# Patient Record
Sex: Male | Born: 1940
Health system: Southern US, Community
[De-identification: ages and names within clinical notes are randomized; demographics above are authoritative.]

## PROBLEM LIST (undated history)

## (undated) DIAGNOSIS — I10 Essential (primary) hypertension: Secondary | ICD-10-CM

## (undated) DIAGNOSIS — Z87442 Personal history of urinary calculi: Secondary | ICD-10-CM

## (undated) HISTORY — PX: OTHER SURGICAL HISTORY: SHX169

## (undated) HISTORY — PX: APPENDECTOMY: SHX54

## (undated) HISTORY — PX: UMBILICAL HERNIA REPAIR: SHX196

---

## 2002-01-02 ENCOUNTER — Ambulatory Visit (HOSPITAL_COMMUNITY): Admission: RE | Admit: 2002-01-02 | Discharge: 2002-01-02 | Payer: Self-pay | Admitting: Internal Medicine

## 2002-01-02 ENCOUNTER — Encounter: Payer: Self-pay | Admitting: Internal Medicine

## 2003-05-20 ENCOUNTER — Ambulatory Visit (HOSPITAL_COMMUNITY): Admission: RE | Admit: 2003-05-20 | Discharge: 2003-05-20 | Payer: Self-pay | Admitting: Internal Medicine

## 2003-09-11 ENCOUNTER — Ambulatory Visit (HOSPITAL_COMMUNITY): Admission: RE | Admit: 2003-09-11 | Discharge: 2003-09-11 | Payer: Self-pay | Admitting: Internal Medicine

## 2004-07-28 ENCOUNTER — Ambulatory Visit (HOSPITAL_COMMUNITY): Admission: RE | Admit: 2004-07-28 | Discharge: 2004-07-28 | Payer: Self-pay | Admitting: Internal Medicine

## 2006-01-10 ENCOUNTER — Ambulatory Visit (HOSPITAL_COMMUNITY): Admission: RE | Admit: 2006-01-10 | Discharge: 2006-01-10 | Payer: Self-pay | Admitting: Internal Medicine

## 2006-09-01 ENCOUNTER — Ambulatory Visit (HOSPITAL_COMMUNITY): Admission: RE | Admit: 2006-09-01 | Discharge: 2006-09-01 | Payer: Self-pay | Admitting: Internal Medicine

## 2007-12-19 IMAGING — US US CAROTID DUPLEX BILAT
1 series · 13 of 24 positions shown · non-contrast
Comparison: None.

CLINICAL DATA: Amaurosis fugax left eye. History of hypertension.

BILATERAL EXTRACRANIAL CAROTID AND VERTEBRAL ARTERY DUPLEX  DOPPLER ULTRASOUND
EVALUATION  01/10/2006:

[Series 1: unknown · 0.07mm/px · 13 of 57 slices shown]
[im 1/57]
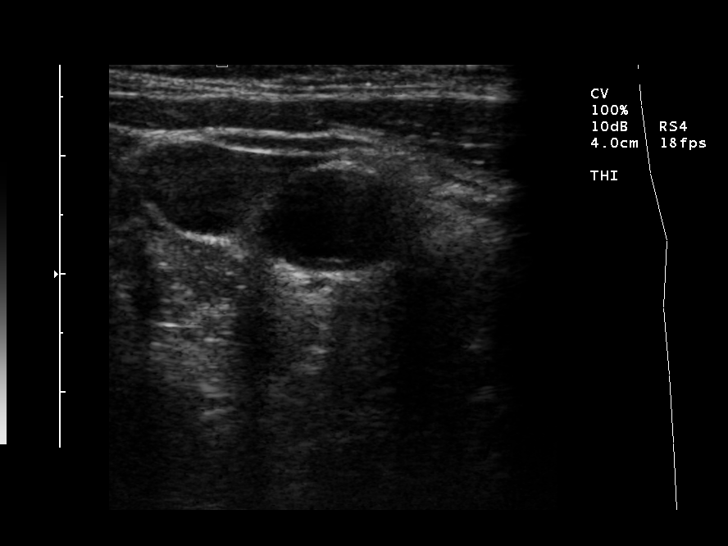
[im 5/57]
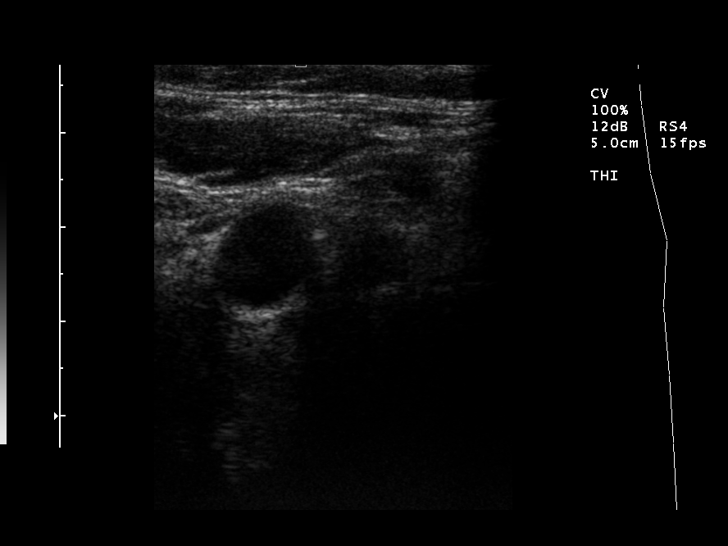
[im 10/57]
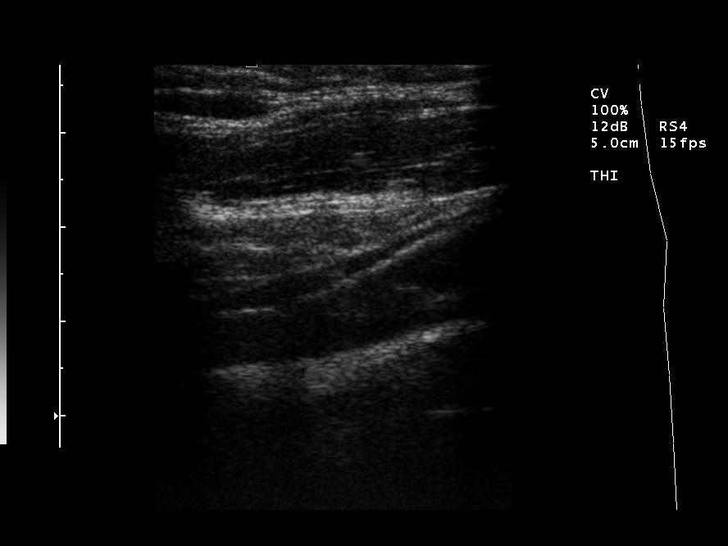
[im 15/57]
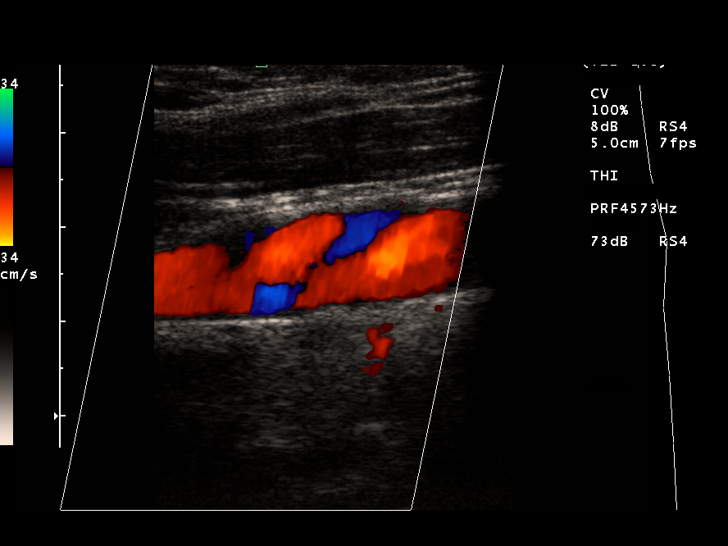
[im 20/57]
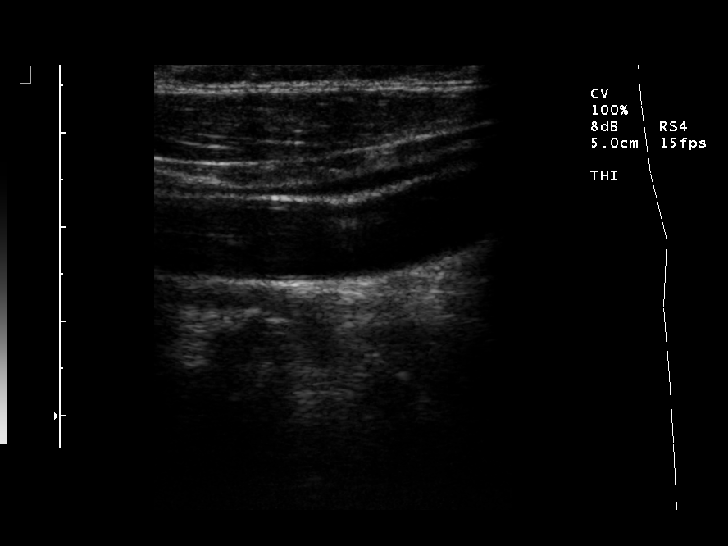
[im 25/57]
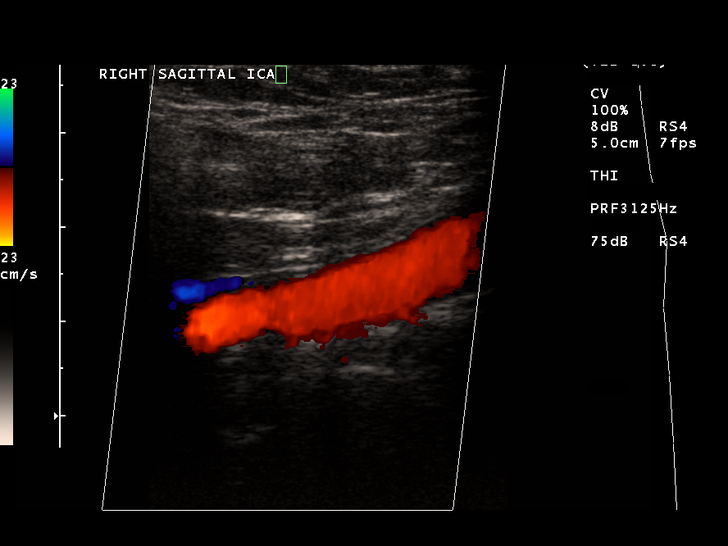
[im 30/57]
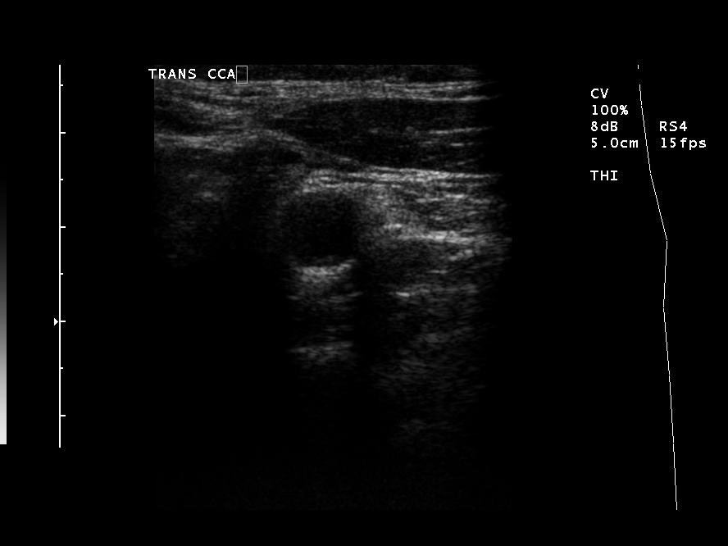
[im 32/57]
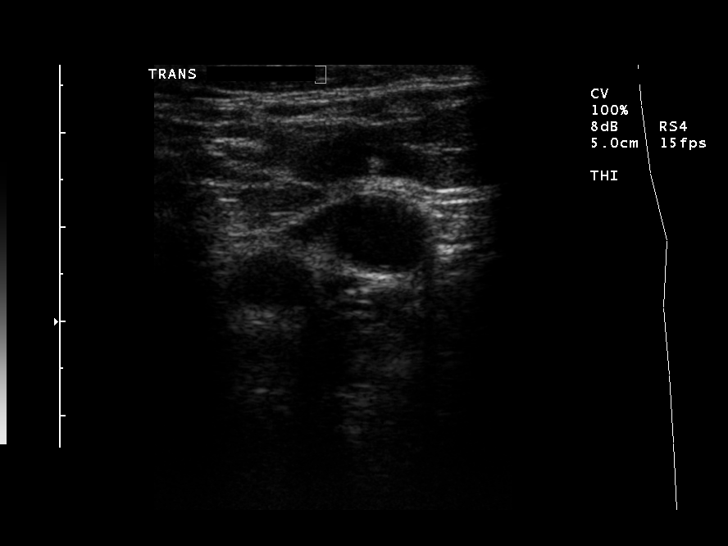
[im 37/57]
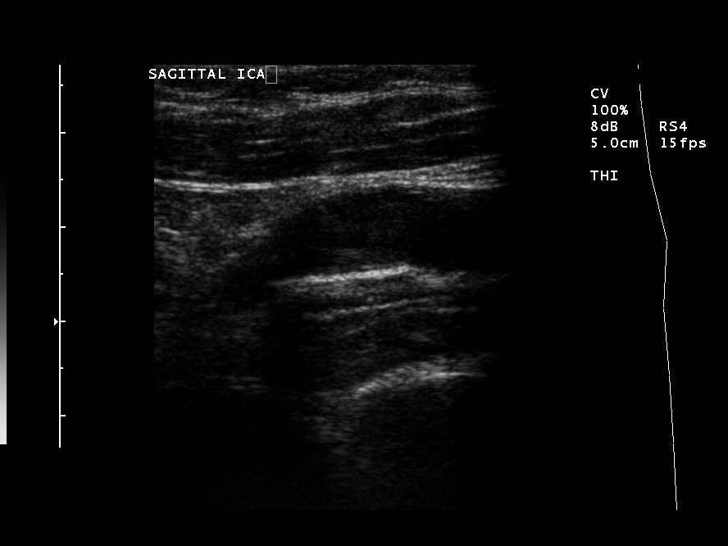
[im 42/57]
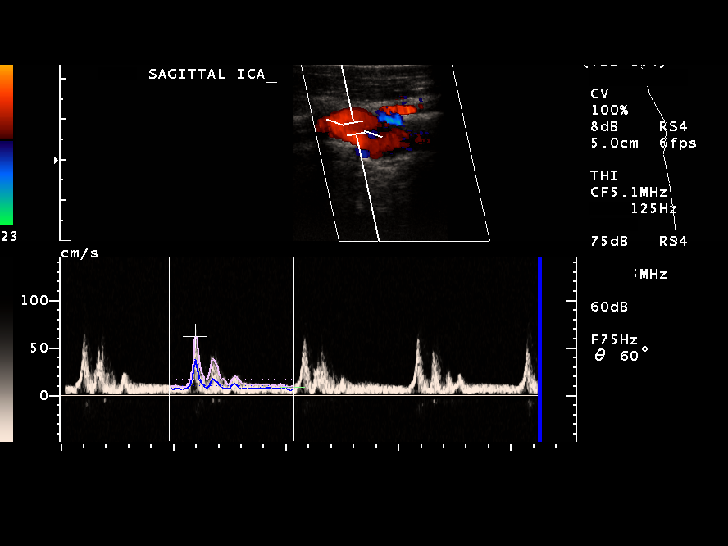
[im 47/57]
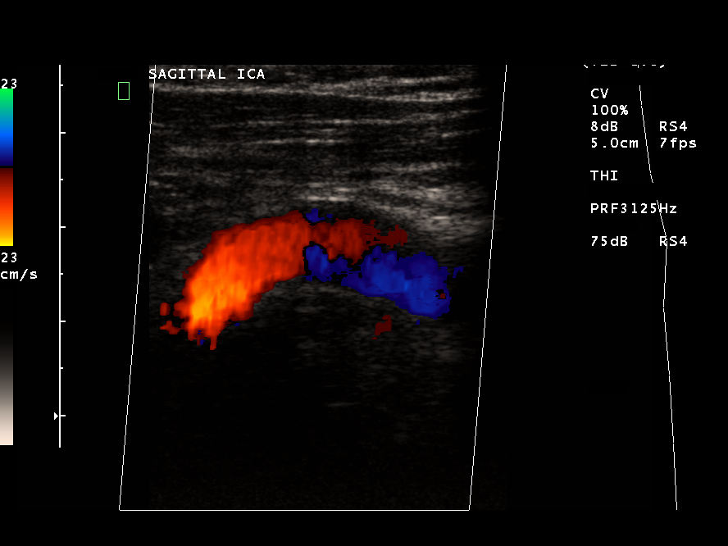
[im 52/57]
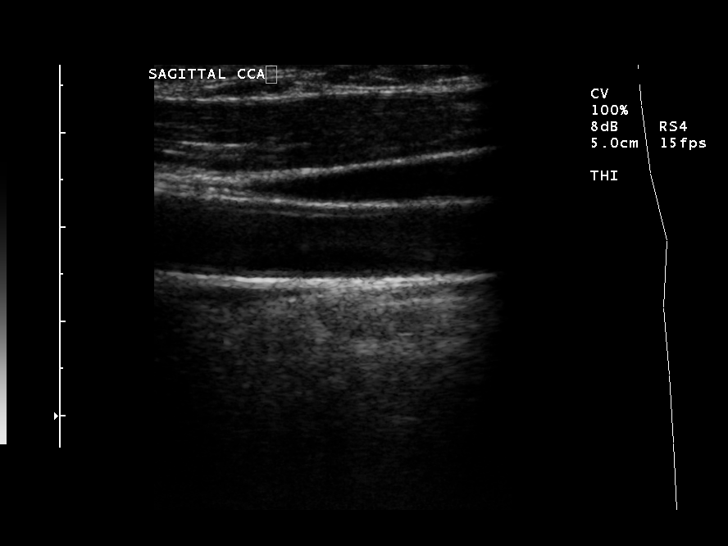
[im 57/57]
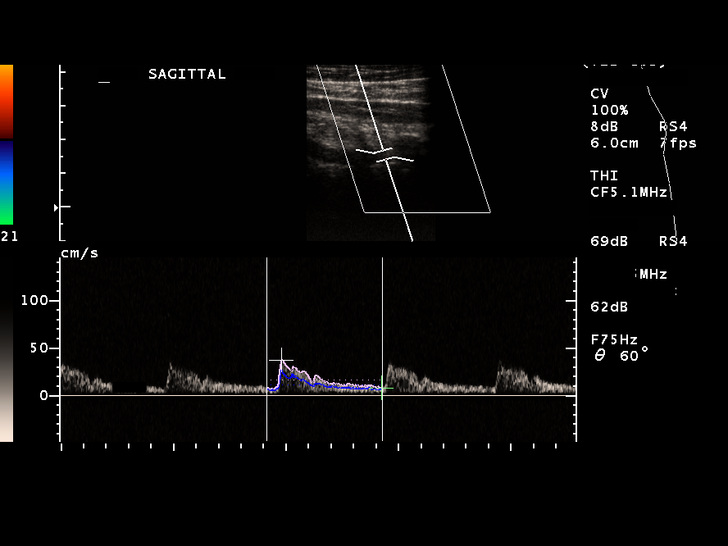

[13 of 24 positions shown; findings below may reference images not displayed]

FINDINGS: The following velocities were measured in centimeters per second (peak
systolic velocity/end-diastolic velocity) :

     RIGHT      LEFT
Common carotid artery:   72.1/8.4   111.9/10.6
Internal carotid artery:   80.8/22.3   64.7/15.1 
External carotid artery:   76.6/6.4   94.5/9.5
ICA/CCA peak systolic ratio:
ICA/CCA end diastolic ratio:

Right carotid:  Minimal soft plaque is noted in the distal right common carotid
artery. This is certainly not flow-limiting visually. The Doppler waveform of
the common carotid is normal. There is no significant plaque within the proximal
right internal carotid artery, and it also has a normal appearing waveform.
Similarly, the right external carotid artery has no significant plaque and a
normal waveform.

Right vertebral:   Antegrade flow.

Left carotid:  There is no significant plaque within the left common carotid
artery, left internal carotid artery, or left external carotid artery. The
Doppler waveforms of each of these vessels is normal.

Left vertebral:   Antegrade flow
IMPRESSION: 1. Minimal soft plaque in the distal right common carotid artery which is not
flow-limiting. There is no significant plaque elsewhere within either carotid
circulation, and there is no evidence of hemodynamically significant stenosis.
2. Antegrade flow in both vertebral arteries.

## 2011-09-28 DIAGNOSIS — L821 Other seborrheic keratosis: Secondary | ICD-10-CM | POA: Diagnosis not present

## 2011-09-28 DIAGNOSIS — Z85828 Personal history of other malignant neoplasm of skin: Secondary | ICD-10-CM | POA: Diagnosis not present

## 2011-09-28 DIAGNOSIS — L570 Actinic keratosis: Secondary | ICD-10-CM | POA: Diagnosis not present

## 2012-02-07 DIAGNOSIS — N400 Enlarged prostate without lower urinary tract symptoms: Secondary | ICD-10-CM | POA: Diagnosis not present

## 2012-02-07 DIAGNOSIS — R350 Frequency of micturition: Secondary | ICD-10-CM | POA: Diagnosis not present

## 2012-02-07 DIAGNOSIS — N200 Calculus of kidney: Secondary | ICD-10-CM | POA: Diagnosis not present

## 2012-03-28 DIAGNOSIS — L570 Actinic keratosis: Secondary | ICD-10-CM | POA: Diagnosis not present

## 2012-03-28 DIAGNOSIS — L821 Other seborrheic keratosis: Secondary | ICD-10-CM | POA: Diagnosis not present

## 2012-03-28 DIAGNOSIS — Z85828 Personal history of other malignant neoplasm of skin: Secondary | ICD-10-CM | POA: Diagnosis not present

## 2012-04-24 DIAGNOSIS — Z125 Encounter for screening for malignant neoplasm of prostate: Secondary | ICD-10-CM | POA: Diagnosis not present

## 2012-04-24 DIAGNOSIS — Z79899 Other long term (current) drug therapy: Secondary | ICD-10-CM | POA: Diagnosis not present

## 2012-04-24 DIAGNOSIS — I10 Essential (primary) hypertension: Secondary | ICD-10-CM | POA: Diagnosis not present

## 2012-05-02 DIAGNOSIS — I10 Essential (primary) hypertension: Secondary | ICD-10-CM | POA: Diagnosis not present

## 2012-05-02 DIAGNOSIS — I498 Other specified cardiac arrhythmias: Secondary | ICD-10-CM | POA: Diagnosis not present

## 2012-05-02 DIAGNOSIS — N400 Enlarged prostate without lower urinary tract symptoms: Secondary | ICD-10-CM | POA: Diagnosis not present

## 2012-05-02 DIAGNOSIS — Z1212 Encounter for screening for malignant neoplasm of rectum: Secondary | ICD-10-CM | POA: Diagnosis not present

## 2012-05-31 DIAGNOSIS — H02839 Dermatochalasis of unspecified eye, unspecified eyelid: Secondary | ICD-10-CM | POA: Diagnosis not present

## 2012-05-31 DIAGNOSIS — H524 Presbyopia: Secondary | ICD-10-CM | POA: Diagnosis not present

## 2012-05-31 DIAGNOSIS — H2510 Age-related nuclear cataract, unspecified eye: Secondary | ICD-10-CM | POA: Diagnosis not present

## 2012-06-26 DIAGNOSIS — H01009 Unspecified blepharitis unspecified eye, unspecified eyelid: Secondary | ICD-10-CM | POA: Diagnosis not present

## 2012-07-07 DIAGNOSIS — Z23 Encounter for immunization: Secondary | ICD-10-CM | POA: Diagnosis not present

## 2012-07-14 DIAGNOSIS — H01009 Unspecified blepharitis unspecified eye, unspecified eyelid: Secondary | ICD-10-CM | POA: Diagnosis not present

## 2012-08-02 DIAGNOSIS — H01009 Unspecified blepharitis unspecified eye, unspecified eyelid: Secondary | ICD-10-CM | POA: Diagnosis not present

## 2012-09-26 DIAGNOSIS — L919 Hypertrophic disorder of the skin, unspecified: Secondary | ICD-10-CM | POA: Diagnosis not present

## 2012-09-26 DIAGNOSIS — Z85828 Personal history of other malignant neoplasm of skin: Secondary | ICD-10-CM | POA: Diagnosis not present

## 2012-09-26 DIAGNOSIS — L909 Atrophic disorder of skin, unspecified: Secondary | ICD-10-CM | POA: Diagnosis not present

## 2012-09-26 DIAGNOSIS — L570 Actinic keratosis: Secondary | ICD-10-CM | POA: Diagnosis not present

## 2012-09-26 DIAGNOSIS — D239 Other benign neoplasm of skin, unspecified: Secondary | ICD-10-CM | POA: Diagnosis not present

## 2012-09-26 DIAGNOSIS — D485 Neoplasm of uncertain behavior of skin: Secondary | ICD-10-CM | POA: Diagnosis not present

## 2012-09-26 DIAGNOSIS — L821 Other seborrheic keratosis: Secondary | ICD-10-CM | POA: Diagnosis not present

## 2012-09-28 DIAGNOSIS — H109 Unspecified conjunctivitis: Secondary | ICD-10-CM | POA: Diagnosis not present

## 2012-09-28 DIAGNOSIS — H02839 Dermatochalasis of unspecified eye, unspecified eyelid: Secondary | ICD-10-CM | POA: Diagnosis not present

## 2012-11-20 DIAGNOSIS — Q188 Other specified congenital malformations of face and neck: Secondary | ICD-10-CM | POA: Diagnosis not present

## 2012-11-20 DIAGNOSIS — H0230 Blepharochalasis unspecified eye, unspecified eyelid: Secondary | ICD-10-CM | POA: Diagnosis not present

## 2012-11-20 DIAGNOSIS — H02839 Dermatochalasis of unspecified eye, unspecified eyelid: Secondary | ICD-10-CM | POA: Diagnosis not present

## 2012-12-22 DIAGNOSIS — H02409 Unspecified ptosis of unspecified eyelid: Secondary | ICD-10-CM | POA: Diagnosis not present

## 2013-01-26 DIAGNOSIS — T783XXA Angioneurotic edema, initial encounter: Secondary | ICD-10-CM | POA: Diagnosis not present

## 2013-01-26 DIAGNOSIS — L272 Dermatitis due to ingested food: Secondary | ICD-10-CM | POA: Diagnosis not present

## 2013-01-26 DIAGNOSIS — L509 Urticaria, unspecified: Secondary | ICD-10-CM | POA: Diagnosis not present

## 2013-01-26 DIAGNOSIS — Z889 Allergy status to unspecified drugs, medicaments and biological substances status: Secondary | ICD-10-CM | POA: Diagnosis not present

## 2013-02-05 DIAGNOSIS — L509 Urticaria, unspecified: Secondary | ICD-10-CM | POA: Diagnosis not present

## 2013-02-05 DIAGNOSIS — L272 Dermatitis due to ingested food: Secondary | ICD-10-CM | POA: Diagnosis not present

## 2013-02-05 DIAGNOSIS — T783XXA Angioneurotic edema, initial encounter: Secondary | ICD-10-CM | POA: Diagnosis not present

## 2013-02-12 ENCOUNTER — Other Ambulatory Visit: Payer: Self-pay | Admitting: Urology

## 2013-02-12 DIAGNOSIS — Z87442 Personal history of urinary calculi: Secondary | ICD-10-CM

## 2013-02-12 DIAGNOSIS — N200 Calculus of kidney: Secondary | ICD-10-CM | POA: Diagnosis not present

## 2013-02-12 DIAGNOSIS — N400 Enlarged prostate without lower urinary tract symptoms: Secondary | ICD-10-CM | POA: Diagnosis not present

## 2013-02-12 DIAGNOSIS — R350 Frequency of micturition: Secondary | ICD-10-CM

## 2013-02-13 ENCOUNTER — Ambulatory Visit
Admission: RE | Admit: 2013-02-13 | Discharge: 2013-02-13 | Disposition: A | Discharge status: Home / Self Care | Payer: Medicare Other | Source: Ambulatory Visit | Attending: Urology | Admitting: Urology

## 2013-02-13 DIAGNOSIS — R350 Frequency of micturition: Secondary | ICD-10-CM

## 2013-02-13 DIAGNOSIS — Z87442 Personal history of urinary calculi: Secondary | ICD-10-CM

## 2013-02-13 DIAGNOSIS — N323 Diverticulum of bladder: Secondary | ICD-10-CM | POA: Diagnosis not present

## 2013-04-17 DIAGNOSIS — L509 Urticaria, unspecified: Secondary | ICD-10-CM | POA: Diagnosis not present

## 2013-04-17 DIAGNOSIS — L570 Actinic keratosis: Secondary | ICD-10-CM | POA: Diagnosis not present

## 2013-04-17 DIAGNOSIS — D1739 Benign lipomatous neoplasm of skin and subcutaneous tissue of other sites: Secondary | ICD-10-CM | POA: Diagnosis not present

## 2013-04-17 DIAGNOSIS — Z85828 Personal history of other malignant neoplasm of skin: Secondary | ICD-10-CM | POA: Diagnosis not present

## 2013-04-23 ENCOUNTER — Encounter (INDEPENDENT_AMBULATORY_CARE_PROVIDER_SITE_OTHER): Payer: Self-pay | Admitting: *Deleted

## 2013-05-01 ENCOUNTER — Telehealth (INDEPENDENT_AMBULATORY_CARE_PROVIDER_SITE_OTHER): Payer: Self-pay | Admitting: *Deleted

## 2013-05-01 ENCOUNTER — Other Ambulatory Visit (INDEPENDENT_AMBULATORY_CARE_PROVIDER_SITE_OTHER): Payer: Self-pay | Admitting: *Deleted

## 2013-05-01 DIAGNOSIS — Z1211 Encounter for screening for malignant neoplasm of colon: Secondary | ICD-10-CM

## 2013-05-01 NOTE — Telephone Encounter (Signed)
Patient needs movi prep -- allergy: sulfur (rash), pcn, prednisone

## 2013-05-04 MED ORDER — PEG-KCL-NACL-NASULF-NA ASC-C 100 G PO SOLR: 1.0000 | Freq: Once | ORAL | Status: DC

## 2013-05-09 DIAGNOSIS — N529 Male erectile dysfunction, unspecified: Secondary | ICD-10-CM | POA: Diagnosis not present

## 2013-05-09 DIAGNOSIS — Z79899 Other long term (current) drug therapy: Secondary | ICD-10-CM | POA: Diagnosis not present

## 2013-05-09 DIAGNOSIS — I10 Essential (primary) hypertension: Secondary | ICD-10-CM | POA: Diagnosis not present

## 2013-05-09 DIAGNOSIS — Z125 Encounter for screening for malignant neoplasm of prostate: Secondary | ICD-10-CM | POA: Diagnosis not present

## 2013-05-17 DIAGNOSIS — I10 Essential (primary) hypertension: Secondary | ICD-10-CM | POA: Diagnosis not present

## 2013-05-17 DIAGNOSIS — N400 Enlarged prostate without lower urinary tract symptoms: Secondary | ICD-10-CM | POA: Diagnosis not present

## 2013-05-17 DIAGNOSIS — I498 Other specified cardiac arrhythmias: Secondary | ICD-10-CM | POA: Diagnosis not present

## 2013-06-14 ENCOUNTER — Telehealth (INDEPENDENT_AMBULATORY_CARE_PROVIDER_SITE_OTHER): Payer: Self-pay | Admitting: *Deleted

## 2013-06-14 NOTE — Telephone Encounter (Signed)
agree

## 2013-06-14 NOTE — Telephone Encounter (Signed)
  Procedure: tcs  Reason/Indication:  screening  Has patient had this procedure before?  Yes, 2004 -- epic  If so, when, by whom and where?    Is there a family history of colon cancer?  no  Who?  What age when diagnosed?    Is patient diabetic?   no      Does patient have prosthetic heart valve?  no  Do you have a pacemaker?  no  Has patient ever had endocarditis? no  Has patient had joint replacement within last 12 months?  no  Does patient tend to be constipated or take laxatives? no  Is patient on Coumadin, Plavix and/or Aspirin? yes  Medications: asa 81 mg daily, atenolol 100 mg daily  Allergies: sulfur (rash), pcn, prednisone  Medication Adjustment: asa 2 days  Procedure date & time: 07/12/13 at 830

## 2013-06-22 DIAGNOSIS — Z23 Encounter for immunization: Secondary | ICD-10-CM | POA: Diagnosis not present

## 2013-06-27 ENCOUNTER — Encounter (HOSPITAL_COMMUNITY): Payer: Self-pay | Admitting: Pharmacy Technician

## 2013-07-12 ENCOUNTER — Ambulatory Visit (HOSPITAL_COMMUNITY)
Admission: RE | Admit: 2013-07-12 | Discharge: 2013-07-12 | Disposition: A | Discharge status: Home / Self Care | Payer: Medicare Other | Source: Ambulatory Visit | Attending: Internal Medicine | Admitting: Internal Medicine

## 2013-07-12 ENCOUNTER — Encounter (HOSPITAL_COMMUNITY): Payer: Self-pay | Admitting: *Deleted

## 2013-07-12 ENCOUNTER — Encounter (HOSPITAL_COMMUNITY)
Admission: RE | Disposition: A | Discharge status: Home / Self Care | Payer: Self-pay | Source: Ambulatory Visit | Attending: Internal Medicine

## 2013-07-12 DIAGNOSIS — K644 Residual hemorrhoidal skin tags: Secondary | ICD-10-CM | POA: Insufficient documentation

## 2013-07-12 DIAGNOSIS — I10 Essential (primary) hypertension: Secondary | ICD-10-CM | POA: Diagnosis not present

## 2013-07-12 DIAGNOSIS — Z1211 Encounter for screening for malignant neoplasm of colon: Secondary | ICD-10-CM | POA: Diagnosis not present

## 2013-07-12 DIAGNOSIS — D126 Benign neoplasm of colon, unspecified: Secondary | ICD-10-CM | POA: Diagnosis not present

## 2013-07-12 HISTORY — PX: COLONOSCOPY: SHX5424

## 2013-07-12 HISTORY — DX: Essential (primary) hypertension: I10

## 2013-07-12 SURGERY — COLONOSCOPY
Anesthesia: Moderate Sedation

## 2013-07-12 MED ORDER — MEPERIDINE HCL 50 MG/ML IJ SOLN
INTRAMUSCULAR | Status: AC
  Filled 2013-07-12: qty 1

## 2013-07-12 MED ORDER — SODIUM CHLORIDE 0.9 % IV SOLN
INTRAVENOUS | Status: DC
  Administered 2013-07-12: 08:00:00 via INTRAVENOUS

## 2013-07-12 MED ORDER — MIDAZOLAM HCL 5 MG/5ML IJ SOLN
INTRAMUSCULAR | Status: AC
  Filled 2013-07-12: qty 10

## 2013-07-12 MED ORDER — MEPERIDINE HCL 50 MG/ML IJ SOLN
INTRAMUSCULAR | Status: DC | PRN
  Administered 2013-07-12 (×2): 25 mg via INTRAVENOUS

## 2013-07-12 MED ORDER — STERILE WATER FOR IRRIGATION IR SOLN
Status: DC | PRN
  Administered 2013-07-12: 09:00:00

## 2013-07-12 MED ORDER — MIDAZOLAM HCL 5 MG/5ML IJ SOLN
INTRAMUSCULAR | Status: DC | PRN
  Administered 2013-07-12: 1 mg via INTRAVENOUS
  Administered 2013-07-12 (×2): 2 mg via INTRAVENOUS

## 2013-07-12 NOTE — Op Note (Signed)
COLONOSCOPY PROCEDURE REPORT  PATIENT:  Rodney Wilson  MR#:  [id number] Birthdate:  30-Oct-1940, 72 y.o., male Endoscopist:  Dr. Malissa Hippo, MD Referred By:  Dr. Carylon Perches, MD Procedure Date: 07/12/2013  Procedure:   Colonoscopy  Indications: Patient is 72 year old Caucasian male who is undergoing average risk screening colonoscopy. His last exam was 10 years ago.  Informed Consent:  The procedure and risks were reviewed with the patient and informed consent was obtained.  Medications:  Demerol 50 mg IV Versed 5 mg IV  Description of procedure:  After a digital rectal exam was performed, that colonoscope was advanced from the anus through the rectum and colon to the area of the cecum, ileocecal valve and appendiceal orifice. The cecum was deeply intubated. These structures were well-seen and photographed for the record. From the level of the cecum and ileocecal valve, the scope was slowly and cautiously withdrawn. The mucosal surfaces were carefully surveyed utilizing scope tip to flexion to facilitate fold flattening as needed. The scope was pulled down into the rectum where a thorough exam including retroflexion was performed.  Findings:   Prep satisfactory. 4 small polyps were removed with cold snare and or biopsy from transverse colon. 5 mm polyp cold snare from sigmoid colon. All of these polyps are submitted together. Normal rectal mucosa. Small hemorrhoids below the dentate line.   Therapeutic/Diagnostic Maneuvers Performed:  See above  Complications:  None  Cecal Withdrawal Time:  12 minutes  Impression:  Examination performed to cecum. Five small polyps removed with cold snare and or cold biopsy and submitted together. Four of these were located at transverse colon and the fifth one was at sigmoid colon. Small external hemorrhoids.  Recommendations:  Standard instructions given. I will contact patient with biopsy results and further  recommendations.  Mollie Rossano U  07/12/2013 9:23 AM  CC: Dr. Carylon Perches, MD & Dr. Bonnetta Barry ref. provider found

## 2013-07-12 NOTE — H&P (Signed)
Rodney Wilson is an 72 y.o. male.   Chief Complaint: Patient is here for colonoscopy. HPI: Patient is 72 year old Caucasian male who is in for screening colonoscopy. He denies abdominal pain change in his bowel habits or rectal bleeding. Family history is negative for CRC. Patient's last exam was 10 years ago.  Past Medical History  Diagnosis Date  . Hypertension     Past Surgical History  Procedure Laterality Date  . Appendectomy    . Umbilical hernia repair    . Kidney stone removal      Family History  Problem Relation Age of Onset  . Colon cancer Neg Hx    Social History:  reports that he has never smoked. He does not have any smokeless tobacco history on file. He reports that he does not drink alcohol or use illicit drugs.  Allergies:  Allergies  Allergen Reactions  . Penicillins Swelling and Rash  . Prednisone Rash  . Sulfa Antibiotics Swelling and Rash    Medications Prior to Admission  Medication Sig Dispense Refill  . atenolol (TENORMIN) 100 MG tablet Take 100 mg by mouth daily.      . peg 3350 powder (MOVIPREP) 100 G SOLR Take 1 kit (200 g total) by mouth once.  1 kit  0  . aspirin EC 81 MG tablet Take 81 mg by mouth daily.        No results found for this or any previous visit (from the past 48 hour(s)). No results found.  ROS  Blood pressure 162/90, pulse 68, temperature 97.8 F (36.6 C), temperature source Oral, resp. rate 14, height 5' 10.5" (1.791 m), weight 220 lb (99.791 kg), SpO2 93.00%. Physical Exam  Constitutional: He appears well-developed and well-nourished.  HENT:  Mouth/Throat: Oropharynx is clear and moist.  Eyes: Conjunctivae are normal. No scleral icterus.  Neck: No thyromegaly present.  Cardiovascular: Normal rate, regular rhythm and normal heart sounds.   No murmur heard. Respiratory: Effort normal and breath sounds normal.  GI: Soft. He exhibits no distension and no mass. There is no tenderness.  Midabdominal scar secondary to  previous hernia surgery  Musculoskeletal: He exhibits no edema.  Lymphadenopathy:    He has no cervical adenopathy.  Neurological: He is alert.  Skin: Skin is warm and dry.     Assessment/Plan Average risk screening colonoscopy.  Jadeyn Hargett U 07/12/2013, 8:37 AM

## 2013-07-16 ENCOUNTER — Encounter (HOSPITAL_COMMUNITY): Payer: Self-pay | Admitting: Internal Medicine

## 2013-07-24 ENCOUNTER — Encounter (INDEPENDENT_AMBULATORY_CARE_PROVIDER_SITE_OTHER): Payer: Self-pay | Admitting: *Deleted

## 2013-09-03 DIAGNOSIS — J019 Acute sinusitis, unspecified: Secondary | ICD-10-CM | POA: Diagnosis not present

## 2013-11-15 DIAGNOSIS — L608 Other nail disorders: Secondary | ICD-10-CM | POA: Diagnosis not present

## 2013-11-15 DIAGNOSIS — M79609 Pain in unspecified limb: Secondary | ICD-10-CM | POA: Diagnosis not present

## 2013-12-26 DIAGNOSIS — L821 Other seborrheic keratosis: Secondary | ICD-10-CM | POA: Diagnosis not present

## 2013-12-26 DIAGNOSIS — Z85828 Personal history of other malignant neoplasm of skin: Secondary | ICD-10-CM | POA: Diagnosis not present

## 2013-12-26 DIAGNOSIS — L570 Actinic keratosis: Secondary | ICD-10-CM | POA: Diagnosis not present

## 2014-02-18 ENCOUNTER — Ambulatory Visit
Admission: RE | Admit: 2014-02-18 | Discharge: 2014-02-18 | Disposition: A | Discharge status: Home / Self Care | Payer: Medicare Other | Source: Ambulatory Visit | Attending: Urology | Admitting: Urology

## 2014-02-18 ENCOUNTER — Other Ambulatory Visit: Payer: Self-pay | Admitting: Urology

## 2014-02-18 DIAGNOSIS — N200 Calculus of kidney: Secondary | ICD-10-CM

## 2014-02-18 DIAGNOSIS — N400 Enlarged prostate without lower urinary tract symptoms: Secondary | ICD-10-CM | POA: Diagnosis not present

## 2014-02-18 DIAGNOSIS — N320 Bladder-neck obstruction: Secondary | ICD-10-CM | POA: Diagnosis not present

## 2014-02-18 DIAGNOSIS — R9389 Abnormal findings on diagnostic imaging of other specified body structures: Secondary | ICD-10-CM | POA: Diagnosis not present

## 2014-05-13 DIAGNOSIS — Z79899 Other long term (current) drug therapy: Secondary | ICD-10-CM | POA: Diagnosis not present

## 2014-05-13 DIAGNOSIS — I10 Essential (primary) hypertension: Secondary | ICD-10-CM | POA: Diagnosis not present

## 2014-05-13 DIAGNOSIS — Z125 Encounter for screening for malignant neoplasm of prostate: Secondary | ICD-10-CM | POA: Diagnosis not present

## 2014-05-13 DIAGNOSIS — N529 Male erectile dysfunction, unspecified: Secondary | ICD-10-CM | POA: Diagnosis not present

## 2014-05-21 DIAGNOSIS — Z0000 Encounter for general adult medical examination without abnormal findings: Secondary | ICD-10-CM | POA: Diagnosis not present

## 2014-06-03 DIAGNOSIS — R51 Headache: Secondary | ICD-10-CM | POA: Diagnosis not present

## 2014-06-03 DIAGNOSIS — H1045 Other chronic allergic conjunctivitis: Secondary | ICD-10-CM | POA: Diagnosis not present

## 2014-06-03 DIAGNOSIS — H2513 Age-related nuclear cataract, bilateral: Secondary | ICD-10-CM | POA: Diagnosis not present

## 2014-06-05 ENCOUNTER — Other Ambulatory Visit: Payer: Self-pay | Admitting: Dermatology

## 2014-06-05 DIAGNOSIS — C44329 Squamous cell carcinoma of skin of other parts of face: Secondary | ICD-10-CM | POA: Diagnosis not present

## 2014-06-05 DIAGNOSIS — D485 Neoplasm of uncertain behavior of skin: Secondary | ICD-10-CM | POA: Diagnosis not present

## 2014-06-05 DIAGNOSIS — L570 Actinic keratosis: Secondary | ICD-10-CM | POA: Diagnosis not present

## 2014-06-05 DIAGNOSIS — Z85828 Personal history of other malignant neoplasm of skin: Secondary | ICD-10-CM | POA: Diagnosis not present

## 2014-06-10 ENCOUNTER — Other Ambulatory Visit: Payer: Self-pay | Admitting: *Deleted

## 2014-06-10 ENCOUNTER — Other Ambulatory Visit: Payer: Self-pay | Admitting: Urology

## 2014-06-10 ENCOUNTER — Ambulatory Visit
Admission: RE | Admit: 2014-06-10 | Discharge: 2014-06-10 | Disposition: A | Discharge status: Home / Self Care | Payer: Medicare Other | Source: Ambulatory Visit | Attending: Urology | Admitting: Urology

## 2014-06-10 DIAGNOSIS — N4340 Spermatocele of epididymis, unspecified: Secondary | ICD-10-CM | POA: Diagnosis not present

## 2014-06-10 DIAGNOSIS — N5089 Other specified disorders of the male genital organs: Secondary | ICD-10-CM

## 2014-06-10 DIAGNOSIS — N4341 Spermatocele of epididymis, single: Secondary | ICD-10-CM | POA: Diagnosis not present

## 2014-06-10 DIAGNOSIS — N200 Calculus of kidney: Secondary | ICD-10-CM | POA: Diagnosis not present

## 2014-06-10 DIAGNOSIS — N401 Enlarged prostate with lower urinary tract symptoms: Secondary | ICD-10-CM | POA: Diagnosis not present

## 2014-06-13 DIAGNOSIS — Z23 Encounter for immunization: Secondary | ICD-10-CM | POA: Diagnosis not present

## 2014-06-19 ENCOUNTER — Other Ambulatory Visit: Payer: Self-pay | Admitting: Dermatology

## 2014-06-19 DIAGNOSIS — C44329 Squamous cell carcinoma of skin of other parts of face: Secondary | ICD-10-CM | POA: Diagnosis not present

## 2014-06-19 DIAGNOSIS — L570 Actinic keratosis: Secondary | ICD-10-CM | POA: Diagnosis not present

## 2014-06-19 DIAGNOSIS — Z85828 Personal history of other malignant neoplasm of skin: Secondary | ICD-10-CM | POA: Diagnosis not present

## 2014-07-29 DIAGNOSIS — H539 Unspecified visual disturbance: Secondary | ICD-10-CM | POA: Diagnosis not present

## 2014-09-06 DIAGNOSIS — I10 Essential (primary) hypertension: Secondary | ICD-10-CM | POA: Diagnosis not present

## 2014-09-06 DIAGNOSIS — N4340 Spermatocele of epididymis, unspecified: Secondary | ICD-10-CM | POA: Diagnosis not present

## 2014-10-04 DIAGNOSIS — Z23 Encounter for immunization: Secondary | ICD-10-CM | POA: Diagnosis not present

## 2014-12-25 DIAGNOSIS — D1801 Hemangioma of skin and subcutaneous tissue: Secondary | ICD-10-CM | POA: Diagnosis not present

## 2014-12-25 DIAGNOSIS — D692 Other nonthrombocytopenic purpura: Secondary | ICD-10-CM | POA: Diagnosis not present

## 2014-12-25 DIAGNOSIS — L821 Other seborrheic keratosis: Secondary | ICD-10-CM | POA: Diagnosis not present

## 2014-12-25 DIAGNOSIS — Z85828 Personal history of other malignant neoplasm of skin: Secondary | ICD-10-CM | POA: Diagnosis not present

## 2014-12-25 DIAGNOSIS — L570 Actinic keratosis: Secondary | ICD-10-CM | POA: Diagnosis not present

## 2015-01-22 IMAGING — US US RENAL
1 series · 14 of 25 positions shown · non-contrast
Comparison: None.

CLINICAL DATA: Hypertension

RENAL/URINARY TRACT ULTRASOUND COMPLETE

[Series 1: us renal · 0.31mm/px · 14 of 27 slices shown]
[im 1/27]
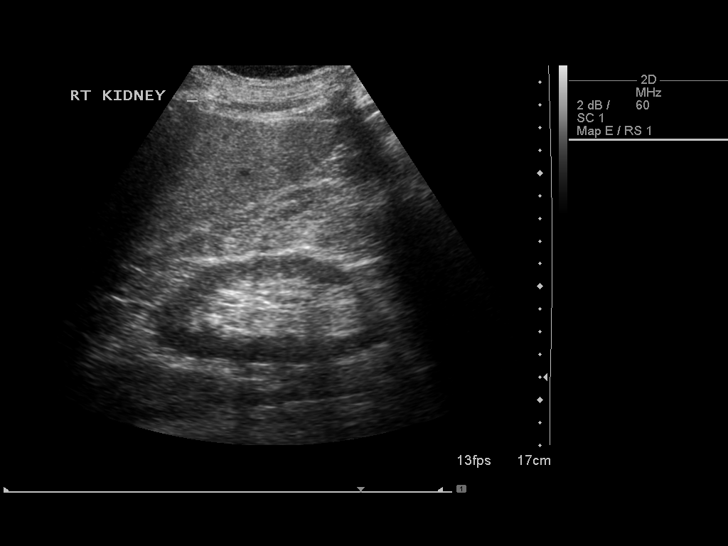
[im 3/27]
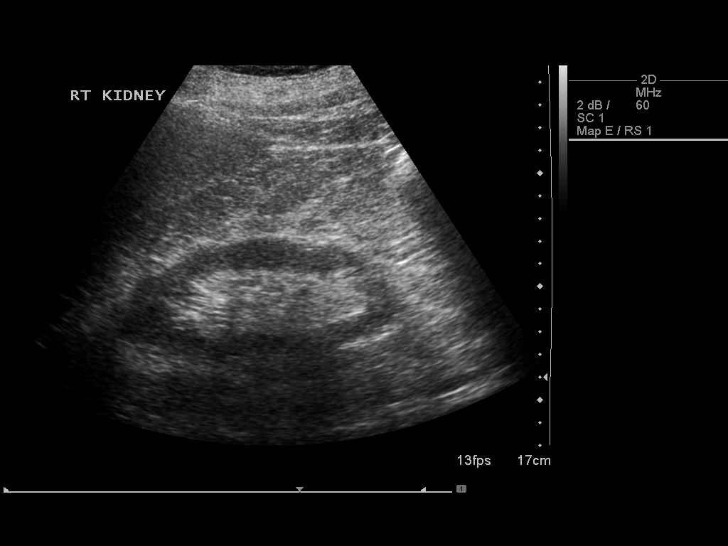
[im 5/27]
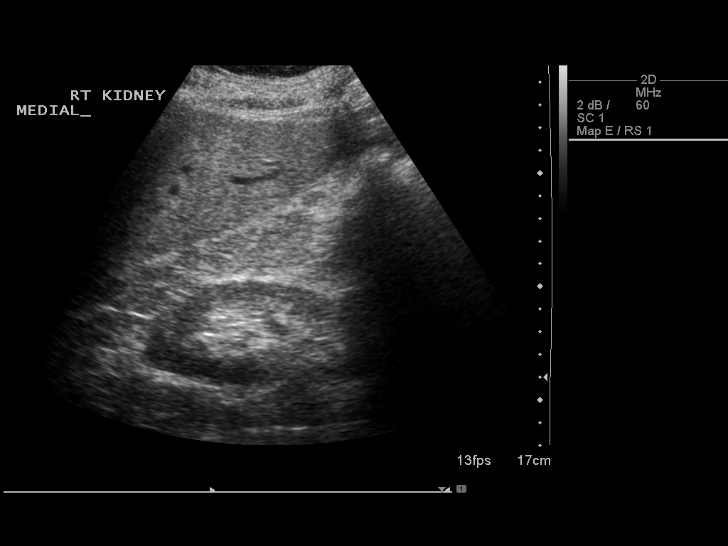
[im 7/27]
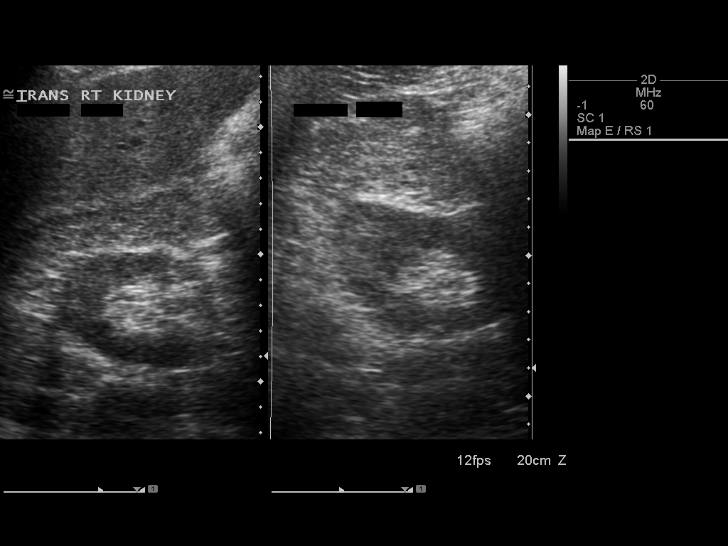
[im 9/27]
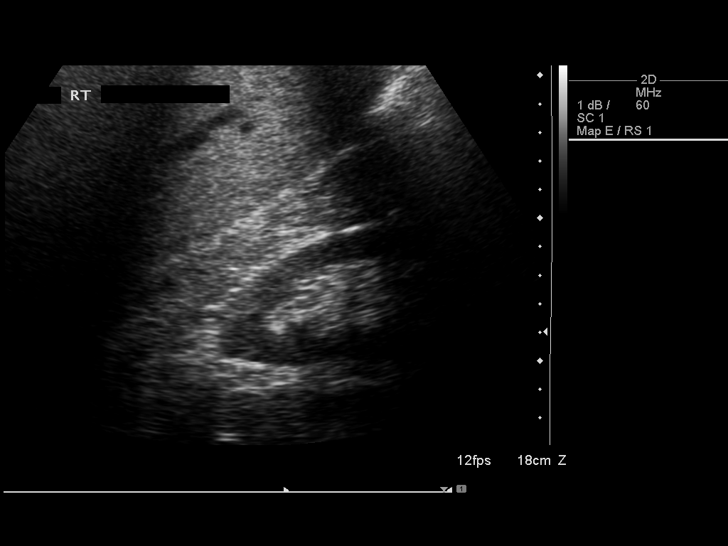
[im 10/27]
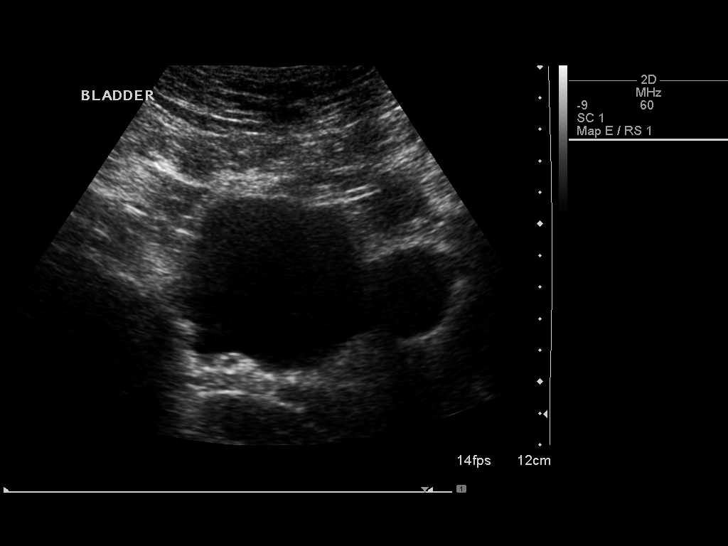
[im 12/27]
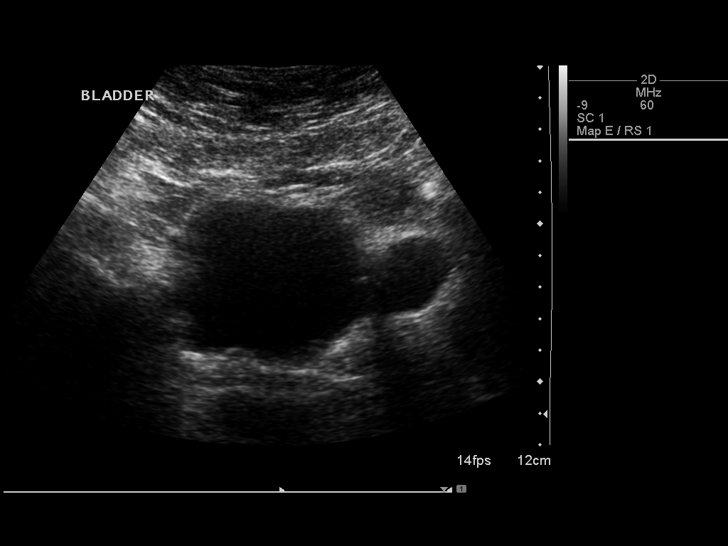
[im 15/27]
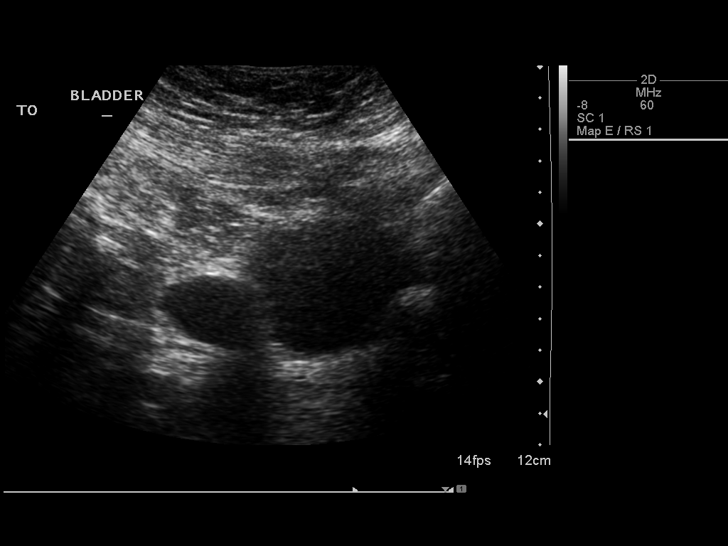
[im 17/27]
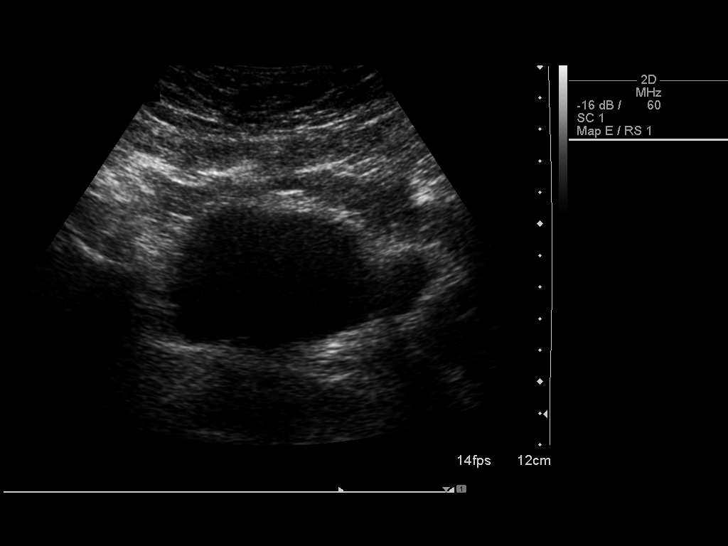
[im 18/27]
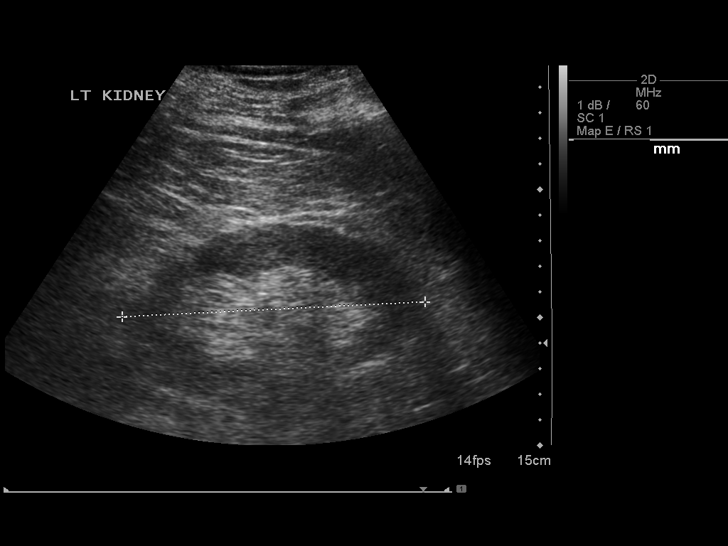
[im 20/27]
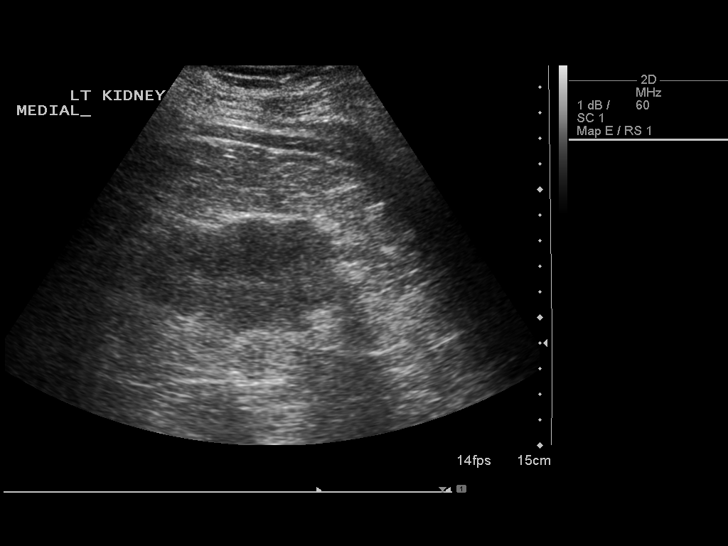
[im 22/27]
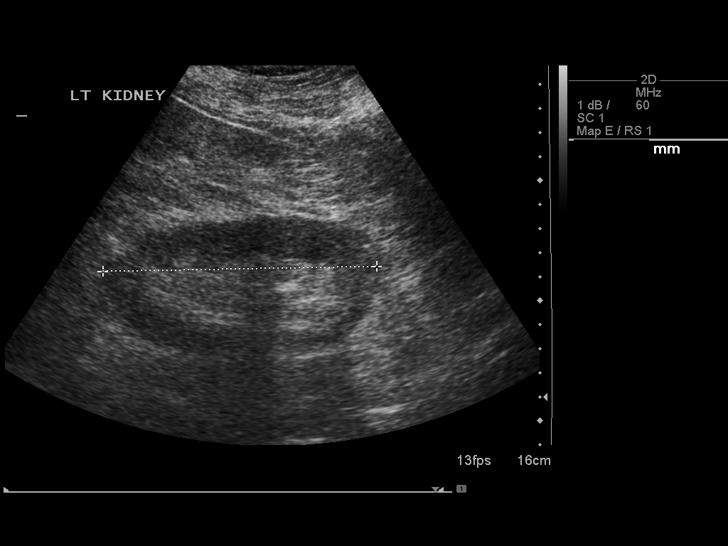
[im 24/27]
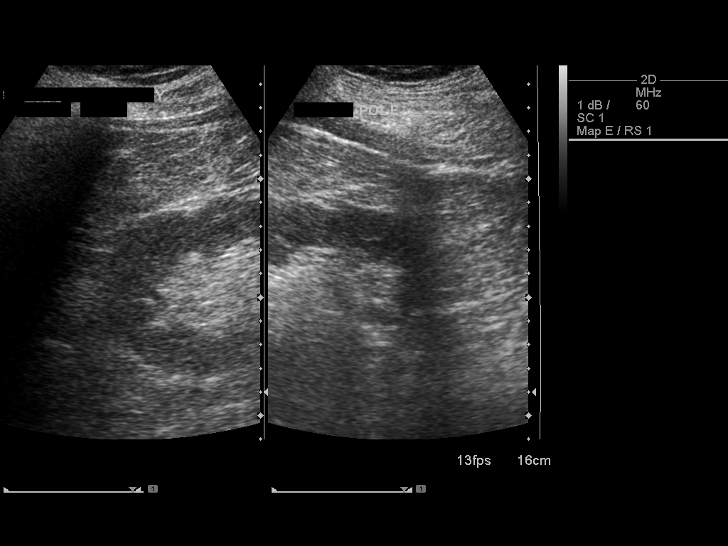
[im 27/27]
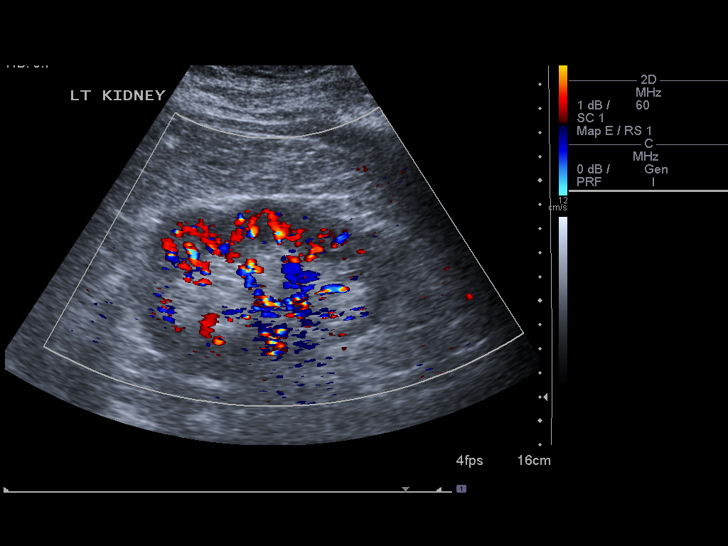

[14 of 25 positions shown; findings below may reference images not displayed]

FINDINGS: Right Kidney:  12.7 cm in greatest length.  No mass lesion or
hydronephrosis is noted.

Left Kidney:  11.9 cm in greatest length.  No mass lesion or
hydronephrosis is noted.

Bladder:  Well distended with evidence of a bladder diverticulum on
the left.
IMPRESSION: Normal appearing kidneys.

Left bladder diverticulum.

## 2015-02-24 DIAGNOSIS — N401 Enlarged prostate with lower urinary tract symptoms: Secondary | ICD-10-CM | POA: Diagnosis not present

## 2015-02-24 DIAGNOSIS — R351 Nocturia: Secondary | ICD-10-CM | POA: Diagnosis not present

## 2015-02-24 DIAGNOSIS — N4340 Spermatocele of epididymis, unspecified: Secondary | ICD-10-CM | POA: Diagnosis not present

## 2015-02-24 DIAGNOSIS — N200 Calculus of kidney: Secondary | ICD-10-CM | POA: Diagnosis not present

## 2015-02-24 DIAGNOSIS — R350 Frequency of micturition: Secondary | ICD-10-CM | POA: Diagnosis not present

## 2015-05-26 DIAGNOSIS — R001 Bradycardia, unspecified: Secondary | ICD-10-CM | POA: Diagnosis not present

## 2015-05-26 DIAGNOSIS — Z125 Encounter for screening for malignant neoplasm of prostate: Secondary | ICD-10-CM | POA: Diagnosis not present

## 2015-05-26 DIAGNOSIS — E8889 Other specified metabolic disorders: Secondary | ICD-10-CM | POA: Diagnosis not present

## 2015-05-26 DIAGNOSIS — Z79899 Other long term (current) drug therapy: Secondary | ICD-10-CM | POA: Diagnosis not present

## 2015-05-26 DIAGNOSIS — N400 Enlarged prostate without lower urinary tract symptoms: Secondary | ICD-10-CM | POA: Diagnosis not present

## 2015-05-26 DIAGNOSIS — I10 Essential (primary) hypertension: Secondary | ICD-10-CM | POA: Diagnosis not present

## 2015-06-03 DIAGNOSIS — Z6833 Body mass index (BMI) 33.0-33.9, adult: Secondary | ICD-10-CM | POA: Diagnosis not present

## 2015-06-03 DIAGNOSIS — D696 Thrombocytopenia, unspecified: Secondary | ICD-10-CM | POA: Diagnosis not present

## 2015-06-03 DIAGNOSIS — R001 Bradycardia, unspecified: Secondary | ICD-10-CM | POA: Diagnosis not present

## 2015-06-03 DIAGNOSIS — I10 Essential (primary) hypertension: Secondary | ICD-10-CM | POA: Diagnosis not present

## 2015-06-04 DIAGNOSIS — H1045 Other chronic allergic conjunctivitis: Secondary | ICD-10-CM | POA: Diagnosis not present

## 2015-06-04 DIAGNOSIS — H2513 Age-related nuclear cataract, bilateral: Secondary | ICD-10-CM | POA: Diagnosis not present

## 2015-06-25 DIAGNOSIS — Z85828 Personal history of other malignant neoplasm of skin: Secondary | ICD-10-CM | POA: Diagnosis not present

## 2015-06-25 DIAGNOSIS — D1801 Hemangioma of skin and subcutaneous tissue: Secondary | ICD-10-CM | POA: Diagnosis not present

## 2015-06-25 DIAGNOSIS — C4441 Basal cell carcinoma of skin of scalp and neck: Secondary | ICD-10-CM | POA: Diagnosis not present

## 2015-06-25 DIAGNOSIS — L821 Other seborrheic keratosis: Secondary | ICD-10-CM | POA: Diagnosis not present

## 2015-06-25 DIAGNOSIS — D485 Neoplasm of uncertain behavior of skin: Secondary | ICD-10-CM | POA: Diagnosis not present

## 2015-06-25 DIAGNOSIS — D225 Melanocytic nevi of trunk: Secondary | ICD-10-CM | POA: Diagnosis not present

## 2015-06-25 DIAGNOSIS — L570 Actinic keratosis: Secondary | ICD-10-CM | POA: Diagnosis not present

## 2015-06-27 DIAGNOSIS — Z23 Encounter for immunization: Secondary | ICD-10-CM | POA: Diagnosis not present

## 2015-12-04 DIAGNOSIS — Z6834 Body mass index (BMI) 34.0-34.9, adult: Secondary | ICD-10-CM | POA: Diagnosis not present

## 2015-12-04 DIAGNOSIS — M6530 Trigger finger, unspecified finger: Secondary | ICD-10-CM | POA: Diagnosis not present

## 2015-12-04 DIAGNOSIS — I10 Essential (primary) hypertension: Secondary | ICD-10-CM | POA: Diagnosis not present

## 2015-12-10 DIAGNOSIS — Z85828 Personal history of other malignant neoplasm of skin: Secondary | ICD-10-CM | POA: Diagnosis not present

## 2015-12-10 DIAGNOSIS — D1801 Hemangioma of skin and subcutaneous tissue: Secondary | ICD-10-CM | POA: Diagnosis not present

## 2015-12-10 DIAGNOSIS — L821 Other seborrheic keratosis: Secondary | ICD-10-CM | POA: Diagnosis not present

## 2015-12-10 DIAGNOSIS — L723 Sebaceous cyst: Secondary | ICD-10-CM | POA: Diagnosis not present

## 2015-12-10 DIAGNOSIS — L570 Actinic keratosis: Secondary | ICD-10-CM | POA: Diagnosis not present

## 2015-12-10 DIAGNOSIS — D225 Melanocytic nevi of trunk: Secondary | ICD-10-CM | POA: Diagnosis not present

## 2016-02-23 DIAGNOSIS — R350 Frequency of micturition: Secondary | ICD-10-CM | POA: Diagnosis not present

## 2016-02-23 DIAGNOSIS — N200 Calculus of kidney: Secondary | ICD-10-CM | POA: Diagnosis not present

## 2016-02-23 DIAGNOSIS — N4340 Spermatocele of epididymis, unspecified: Secondary | ICD-10-CM | POA: Diagnosis not present

## 2016-02-23 DIAGNOSIS — N323 Diverticulum of bladder: Secondary | ICD-10-CM | POA: Diagnosis not present

## 2016-02-23 DIAGNOSIS — N400 Enlarged prostate without lower urinary tract symptoms: Secondary | ICD-10-CM | POA: Diagnosis not present

## 2016-02-23 DIAGNOSIS — N401 Enlarged prostate with lower urinary tract symptoms: Secondary | ICD-10-CM | POA: Diagnosis not present

## 2016-02-23 DIAGNOSIS — R351 Nocturia: Secondary | ICD-10-CM | POA: Diagnosis not present

## 2016-05-18 IMAGING — US US SCROTUM
1 series · 14 of 25 positions shown · non-contrast
Comparison: None.

CLINICAL DATA: Scrotal mass.

EXAM:
ULTRASOUND OF SCROTUM
TECHNIQUE: Complete ultrasound examination of the testicles, epididymis, and
other scrotal structures was performed.

[Series 1: us scrotum · 33 acquisitions, 14 frames shown]
[im 1/33]
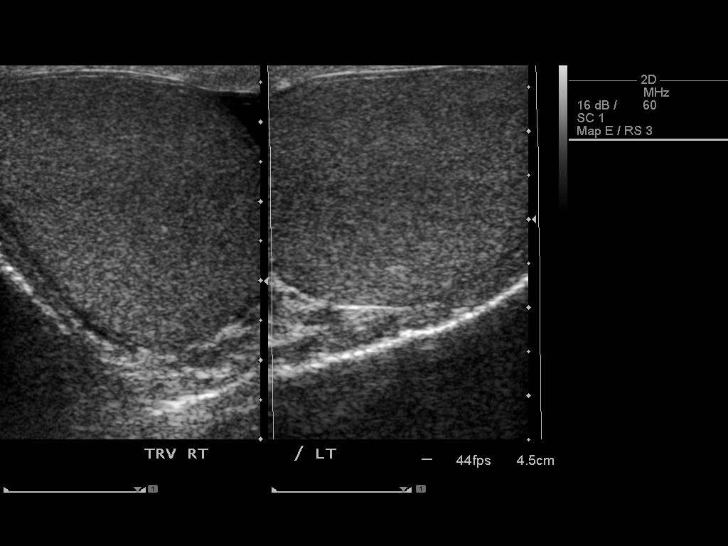
[im 3/33]
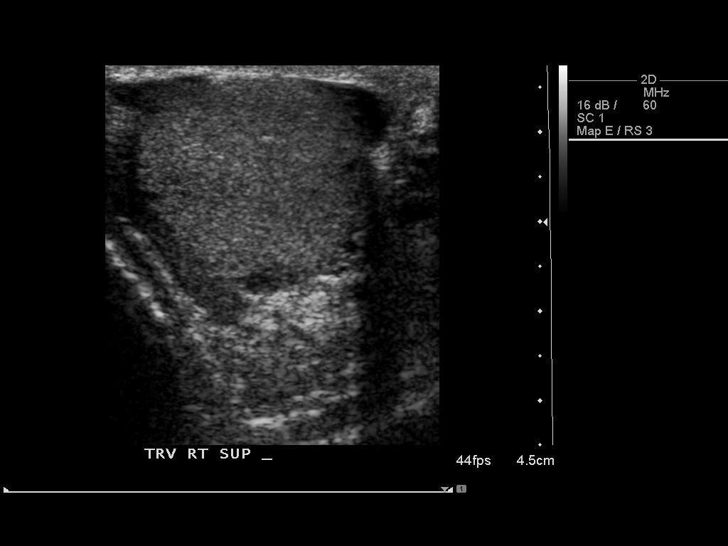
[im 6/33]
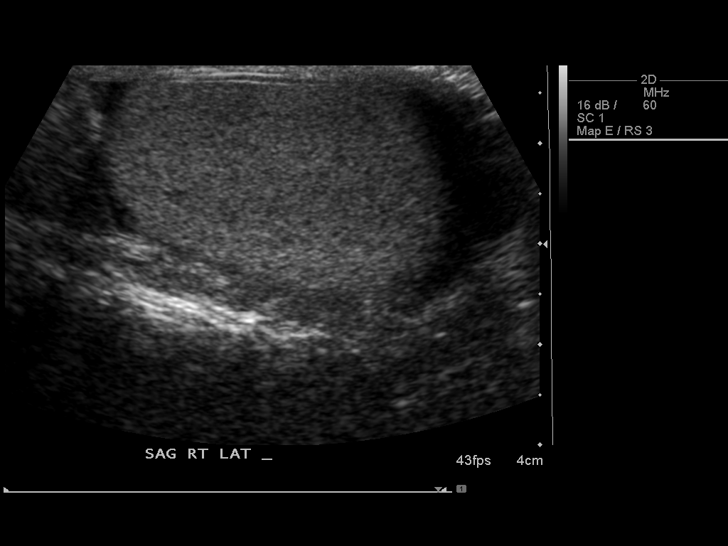
[im 9/33]
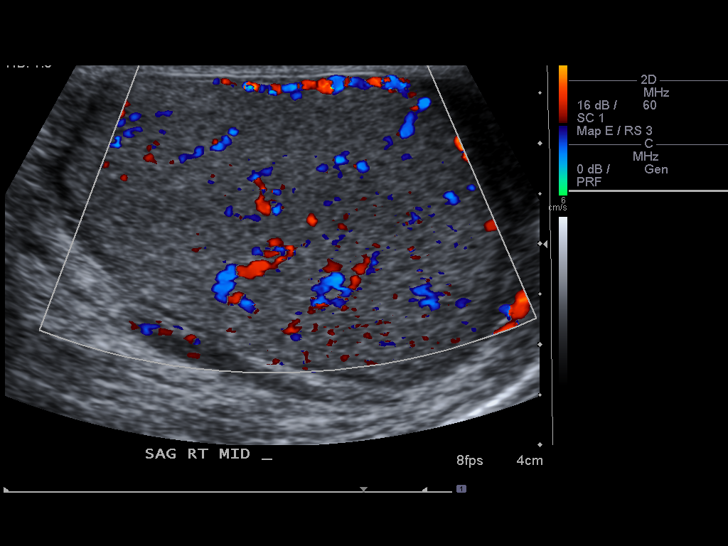
[im 11/33]
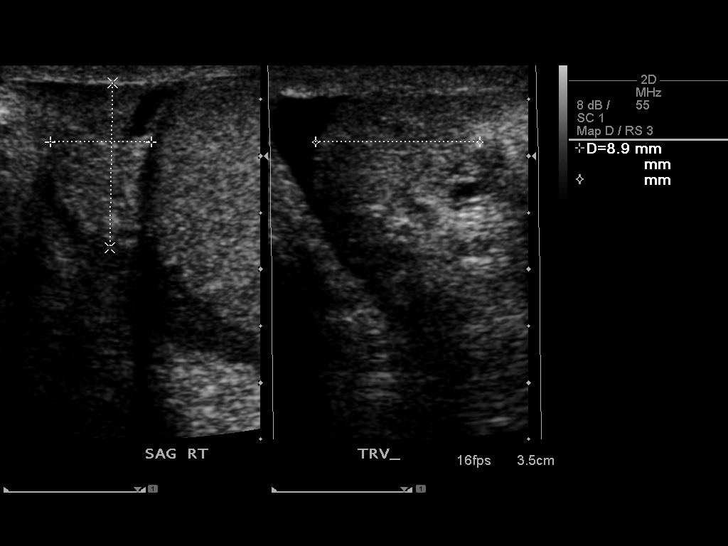
[im 13/33]
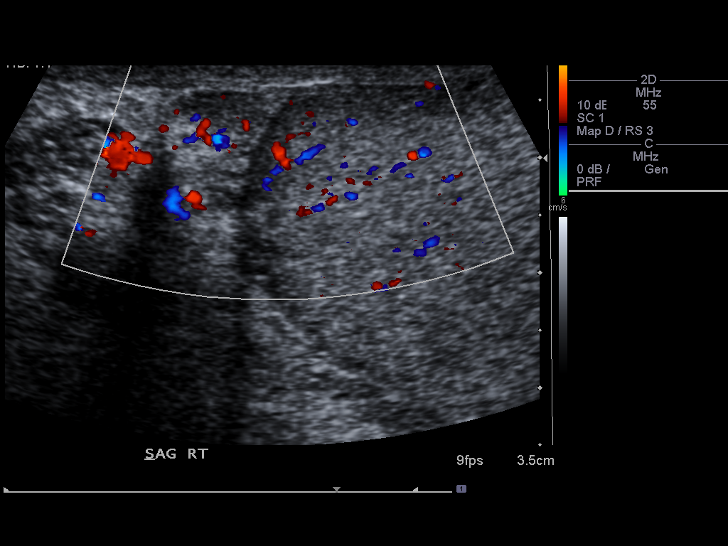
[im 15/33]
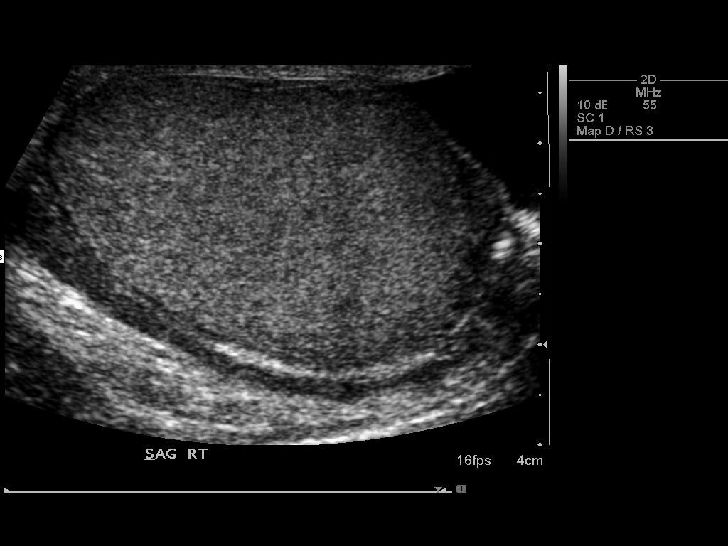
[im 18/33]
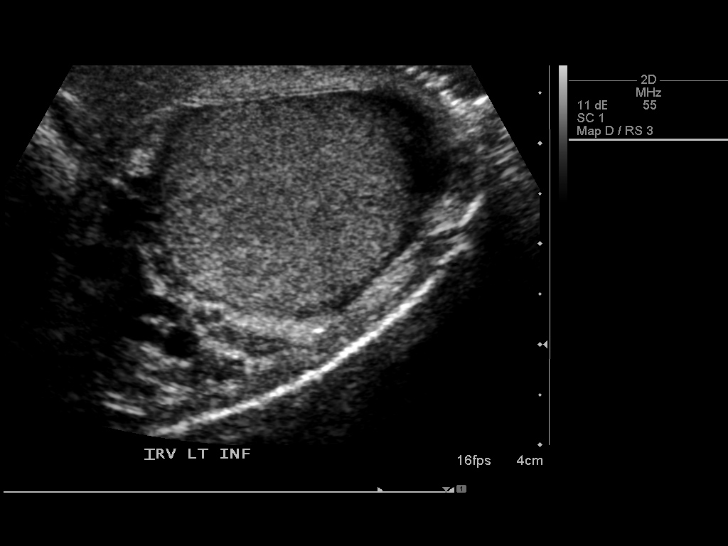
[im 21/33]
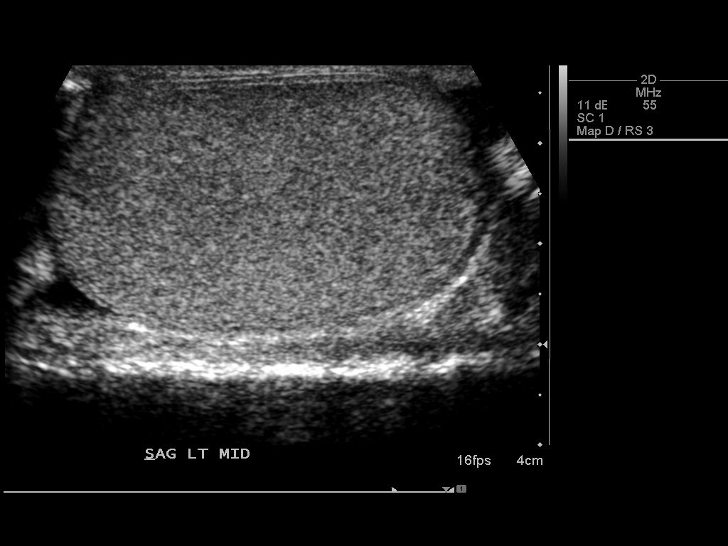
[im 22/33]
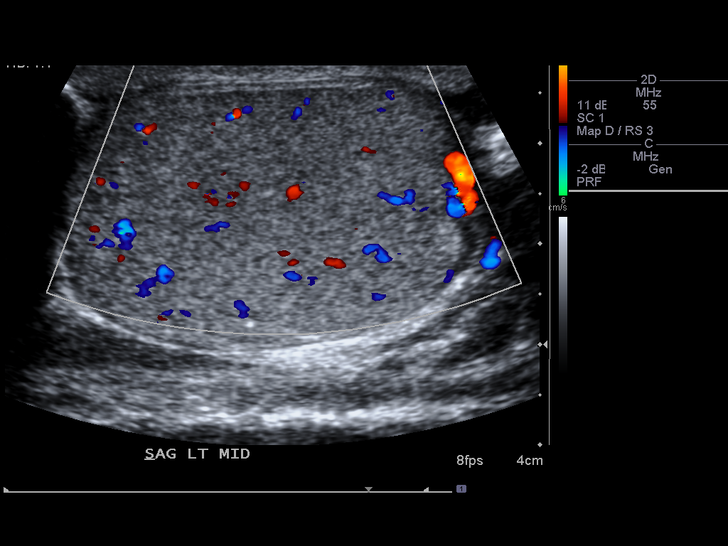
[im 25/33]
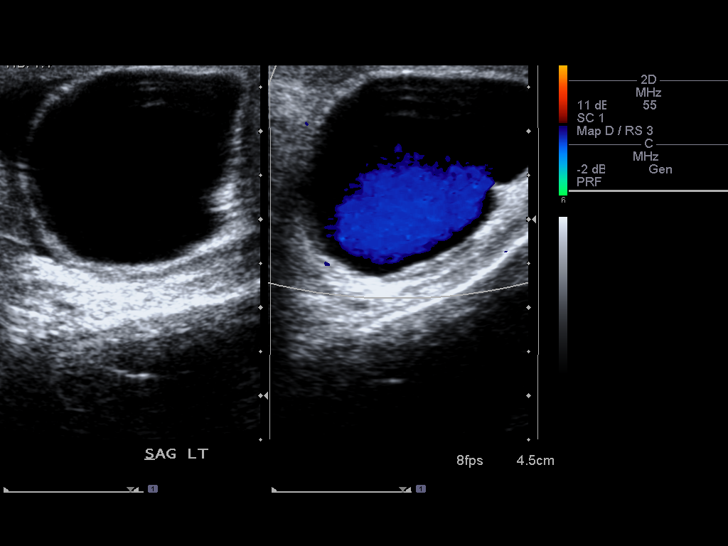
[im 27/33]
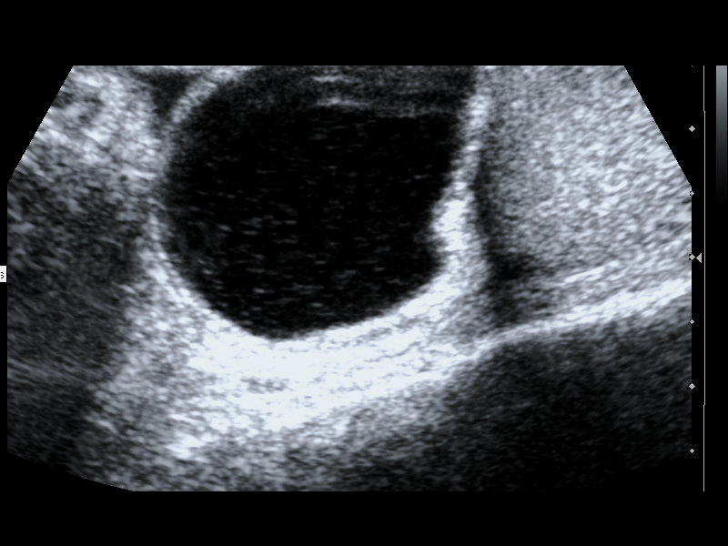
[im 30/33]
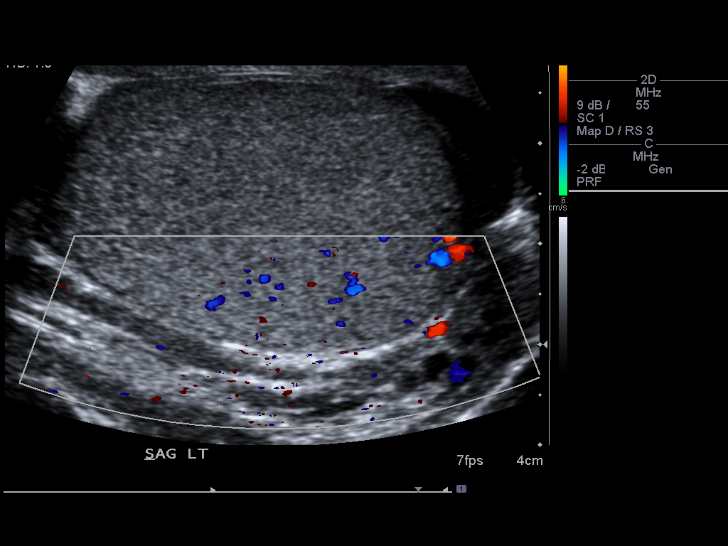
[im 33/33]
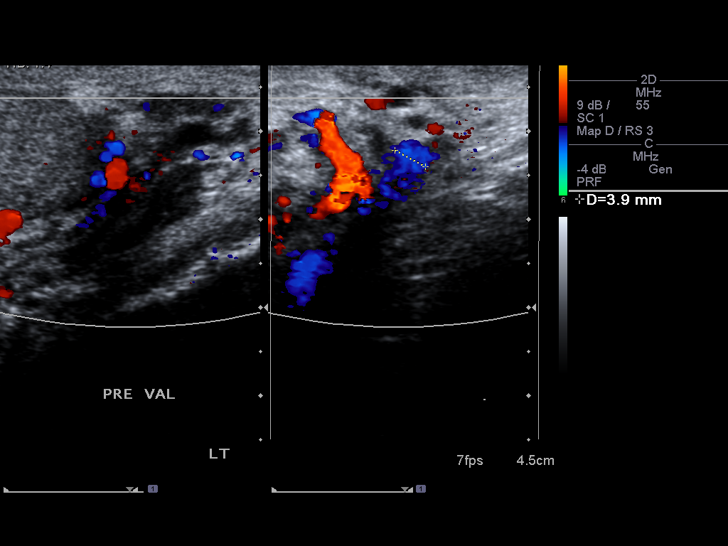

[14 of 25 positions shown; findings below may reference images not displayed]

FINDINGS: Right testicle

Measurements: 4.4 x 3.0 x 3.2 cm. No mass or microlithiasis
visualized.

Left testicle

Measurements: 4.4 x 2.5 x 3.7 cm. No mass or microlithiasis
visualized.

Right epididymis:  Normal in size and appearance.

Left epididymis: Normal in size and appearance. 2.5 x 2.5 x 2.9 cm
left spermatocele.

Hydrocele:  None visualized.

Varicocele:  None visualized.
IMPRESSION: Left spermatocele.  No evidence of torsion.

## 2016-06-07 DIAGNOSIS — Z79899 Other long term (current) drug therapy: Secondary | ICD-10-CM | POA: Diagnosis not present

## 2016-06-07 DIAGNOSIS — Z125 Encounter for screening for malignant neoplasm of prostate: Secondary | ICD-10-CM | POA: Diagnosis not present

## 2016-06-07 DIAGNOSIS — G459 Transient cerebral ischemic attack, unspecified: Secondary | ICD-10-CM | POA: Diagnosis not present

## 2016-06-07 DIAGNOSIS — R001 Bradycardia, unspecified: Secondary | ICD-10-CM | POA: Diagnosis not present

## 2016-06-07 DIAGNOSIS — N400 Enlarged prostate without lower urinary tract symptoms: Secondary | ICD-10-CM | POA: Diagnosis not present

## 2016-06-07 DIAGNOSIS — E8889 Other specified metabolic disorders: Secondary | ICD-10-CM | POA: Diagnosis not present

## 2016-06-07 DIAGNOSIS — I10 Essential (primary) hypertension: Secondary | ICD-10-CM | POA: Diagnosis not present

## 2016-06-09 DIAGNOSIS — H1045 Other chronic allergic conjunctivitis: Secondary | ICD-10-CM | POA: Diagnosis not present

## 2016-06-09 DIAGNOSIS — H35033 Hypertensive retinopathy, bilateral: Secondary | ICD-10-CM | POA: Diagnosis not present

## 2016-06-09 DIAGNOSIS — H2513 Age-related nuclear cataract, bilateral: Secondary | ICD-10-CM | POA: Diagnosis not present

## 2016-06-09 DIAGNOSIS — H3581 Retinal edema: Secondary | ICD-10-CM | POA: Diagnosis not present

## 2016-06-10 DIAGNOSIS — D1801 Hemangioma of skin and subcutaneous tissue: Secondary | ICD-10-CM | POA: Diagnosis not present

## 2016-06-10 DIAGNOSIS — D225 Melanocytic nevi of trunk: Secondary | ICD-10-CM | POA: Diagnosis not present

## 2016-06-10 DIAGNOSIS — L821 Other seborrheic keratosis: Secondary | ICD-10-CM | POA: Diagnosis not present

## 2016-06-10 DIAGNOSIS — L570 Actinic keratosis: Secondary | ICD-10-CM | POA: Diagnosis not present

## 2016-06-10 DIAGNOSIS — D692 Other nonthrombocytopenic purpura: Secondary | ICD-10-CM | POA: Diagnosis not present

## 2016-06-10 DIAGNOSIS — Z85828 Personal history of other malignant neoplasm of skin: Secondary | ICD-10-CM | POA: Diagnosis not present

## 2016-06-11 DIAGNOSIS — R05 Cough: Secondary | ICD-10-CM | POA: Diagnosis not present

## 2016-06-11 DIAGNOSIS — J019 Acute sinusitis, unspecified: Secondary | ICD-10-CM | POA: Diagnosis not present

## 2016-06-14 DIAGNOSIS — Z6834 Body mass index (BMI) 34.0-34.9, adult: Secondary | ICD-10-CM | POA: Diagnosis not present

## 2016-06-14 DIAGNOSIS — Z8673 Personal history of transient ischemic attack (TIA), and cerebral infarction without residual deficits: Secondary | ICD-10-CM | POA: Diagnosis not present

## 2016-06-14 DIAGNOSIS — I10 Essential (primary) hypertension: Secondary | ICD-10-CM | POA: Diagnosis not present

## 2016-08-26 DIAGNOSIS — I10 Essential (primary) hypertension: Secondary | ICD-10-CM | POA: Diagnosis not present

## 2016-08-26 DIAGNOSIS — Z8249 Family history of ischemic heart disease and other diseases of the circulatory system: Secondary | ICD-10-CM | POA: Diagnosis not present

## 2016-08-26 DIAGNOSIS — Z882 Allergy status to sulfonamides status: Secondary | ICD-10-CM | POA: Diagnosis not present

## 2016-08-26 DIAGNOSIS — Z79899 Other long term (current) drug therapy: Secondary | ICD-10-CM | POA: Diagnosis not present

## 2016-08-26 DIAGNOSIS — Z7982 Long term (current) use of aspirin: Secondary | ICD-10-CM | POA: Diagnosis not present

## 2016-08-26 DIAGNOSIS — G451 Carotid artery syndrome (hemispheric): Secondary | ICD-10-CM | POA: Diagnosis not present

## 2016-08-26 DIAGNOSIS — R799 Abnormal finding of blood chemistry, unspecified: Secondary | ICD-10-CM | POA: Diagnosis not present

## 2016-08-26 DIAGNOSIS — R4701 Aphasia: Secondary | ICD-10-CM | POA: Diagnosis not present

## 2016-08-26 DIAGNOSIS — Z880 Allergy status to penicillin: Secondary | ICD-10-CM | POA: Diagnosis not present

## 2016-09-07 DIAGNOSIS — I10 Essential (primary) hypertension: Secondary | ICD-10-CM | POA: Diagnosis not present

## 2016-09-07 DIAGNOSIS — G451 Carotid artery syndrome (hemispheric): Secondary | ICD-10-CM | POA: Diagnosis not present

## 2016-09-08 DIAGNOSIS — G451 Carotid artery syndrome (hemispheric): Secondary | ICD-10-CM | POA: Diagnosis not present

## 2016-09-08 DIAGNOSIS — I10 Essential (primary) hypertension: Secondary | ICD-10-CM | POA: Diagnosis not present

## 2016-10-11 DIAGNOSIS — G451 Carotid artery syndrome (hemispheric): Secondary | ICD-10-CM | POA: Diagnosis not present

## 2016-11-08 DIAGNOSIS — T783XXA Angioneurotic edema, initial encounter: Secondary | ICD-10-CM | POA: Diagnosis not present

## 2016-11-08 DIAGNOSIS — Z6835 Body mass index (BMI) 35.0-35.9, adult: Secondary | ICD-10-CM | POA: Diagnosis not present

## 2016-12-07 DIAGNOSIS — Z889 Allergy status to unspecified drugs, medicaments and biological substances status: Secondary | ICD-10-CM | POA: Diagnosis not present

## 2016-12-07 DIAGNOSIS — L272 Dermatitis due to ingested food: Secondary | ICD-10-CM | POA: Diagnosis not present

## 2016-12-07 DIAGNOSIS — L299 Pruritus, unspecified: Secondary | ICD-10-CM | POA: Diagnosis not present

## 2016-12-07 DIAGNOSIS — T783XXD Angioneurotic edema, subsequent encounter: Secondary | ICD-10-CM | POA: Diagnosis not present

## 2016-12-08 DIAGNOSIS — E785 Hyperlipidemia, unspecified: Secondary | ICD-10-CM | POA: Diagnosis not present

## 2016-12-08 DIAGNOSIS — Z79899 Other long term (current) drug therapy: Secondary | ICD-10-CM | POA: Diagnosis not present

## 2016-12-14 DIAGNOSIS — D692 Other nonthrombocytopenic purpura: Secondary | ICD-10-CM | POA: Diagnosis not present

## 2016-12-14 DIAGNOSIS — L821 Other seborrheic keratosis: Secondary | ICD-10-CM | POA: Diagnosis not present

## 2016-12-14 DIAGNOSIS — Z85828 Personal history of other malignant neoplasm of skin: Secondary | ICD-10-CM | POA: Diagnosis not present

## 2016-12-14 DIAGNOSIS — L570 Actinic keratosis: Secondary | ICD-10-CM | POA: Diagnosis not present

## 2016-12-14 DIAGNOSIS — D1801 Hemangioma of skin and subcutaneous tissue: Secondary | ICD-10-CM | POA: Diagnosis not present

## 2016-12-17 DIAGNOSIS — I10 Essential (primary) hypertension: Secondary | ICD-10-CM | POA: Diagnosis not present

## 2017-01-28 DIAGNOSIS — I10 Essential (primary) hypertension: Secondary | ICD-10-CM | POA: Diagnosis not present

## 2017-01-28 DIAGNOSIS — M109 Gout, unspecified: Secondary | ICD-10-CM | POA: Diagnosis not present

## 2017-03-10 DIAGNOSIS — I10 Essential (primary) hypertension: Secondary | ICD-10-CM | POA: Diagnosis not present

## 2017-03-10 DIAGNOSIS — R609 Edema, unspecified: Secondary | ICD-10-CM | POA: Diagnosis not present

## 2017-03-11 DIAGNOSIS — N4340 Spermatocele of epididymis, unspecified: Secondary | ICD-10-CM | POA: Diagnosis not present

## 2017-03-11 DIAGNOSIS — N323 Diverticulum of bladder: Secondary | ICD-10-CM | POA: Diagnosis not present

## 2017-03-11 DIAGNOSIS — N200 Calculus of kidney: Secondary | ICD-10-CM | POA: Diagnosis not present

## 2017-03-11 DIAGNOSIS — N401 Enlarged prostate with lower urinary tract symptoms: Secondary | ICD-10-CM | POA: Diagnosis not present

## 2017-03-11 DIAGNOSIS — N138 Other obstructive and reflux uropathy: Secondary | ICD-10-CM | POA: Diagnosis not present

## 2017-06-07 DIAGNOSIS — I10 Essential (primary) hypertension: Secondary | ICD-10-CM | POA: Diagnosis not present

## 2017-06-07 DIAGNOSIS — Z79899 Other long term (current) drug therapy: Secondary | ICD-10-CM | POA: Diagnosis not present

## 2017-06-07 DIAGNOSIS — M1990 Unspecified osteoarthritis, unspecified site: Secondary | ICD-10-CM | POA: Diagnosis not present

## 2017-06-07 DIAGNOSIS — G459 Transient cerebral ischemic attack, unspecified: Secondary | ICD-10-CM | POA: Diagnosis not present

## 2017-06-07 DIAGNOSIS — Z125 Encounter for screening for malignant neoplasm of prostate: Secondary | ICD-10-CM | POA: Diagnosis not present

## 2017-06-07 DIAGNOSIS — Z23 Encounter for immunization: Secondary | ICD-10-CM | POA: Diagnosis not present

## 2017-06-07 DIAGNOSIS — D696 Thrombocytopenia, unspecified: Secondary | ICD-10-CM | POA: Diagnosis not present

## 2017-06-20 DIAGNOSIS — I10 Essential (primary) hypertension: Secondary | ICD-10-CM | POA: Diagnosis not present

## 2017-06-20 DIAGNOSIS — N200 Calculus of kidney: Secondary | ICD-10-CM | POA: Diagnosis not present

## 2017-06-20 DIAGNOSIS — G459 Transient cerebral ischemic attack, unspecified: Secondary | ICD-10-CM | POA: Diagnosis not present

## 2017-06-20 DIAGNOSIS — Z6834 Body mass index (BMI) 34.0-34.9, adult: Secondary | ICD-10-CM | POA: Diagnosis not present

## 2017-06-21 DIAGNOSIS — L821 Other seborrheic keratosis: Secondary | ICD-10-CM | POA: Diagnosis not present

## 2017-06-21 DIAGNOSIS — L820 Inflamed seborrheic keratosis: Secondary | ICD-10-CM | POA: Diagnosis not present

## 2017-06-21 DIAGNOSIS — D1801 Hemangioma of skin and subcutaneous tissue: Secondary | ICD-10-CM | POA: Diagnosis not present

## 2017-06-21 DIAGNOSIS — L570 Actinic keratosis: Secondary | ICD-10-CM | POA: Diagnosis not present

## 2017-06-21 DIAGNOSIS — Z85828 Personal history of other malignant neoplasm of skin: Secondary | ICD-10-CM | POA: Diagnosis not present

## 2017-06-21 DIAGNOSIS — D692 Other nonthrombocytopenic purpura: Secondary | ICD-10-CM | POA: Diagnosis not present

## 2017-06-24 DIAGNOSIS — H2513 Age-related nuclear cataract, bilateral: Secondary | ICD-10-CM | POA: Diagnosis not present

## 2017-08-06 DIAGNOSIS — J019 Acute sinusitis, unspecified: Secondary | ICD-10-CM | POA: Diagnosis not present

## 2017-10-10 DIAGNOSIS — M47817 Spondylosis without myelopathy or radiculopathy, lumbosacral region: Secondary | ICD-10-CM | POA: Diagnosis not present

## 2017-10-10 DIAGNOSIS — M545 Low back pain: Secondary | ICD-10-CM | POA: Diagnosis not present

## 2017-10-10 DIAGNOSIS — M4316 Spondylolisthesis, lumbar region: Secondary | ICD-10-CM | POA: Diagnosis not present

## 2017-10-12 DIAGNOSIS — M545 Low back pain: Secondary | ICD-10-CM | POA: Diagnosis not present

## 2017-10-19 DIAGNOSIS — M545 Low back pain: Secondary | ICD-10-CM | POA: Diagnosis not present

## 2017-10-26 DIAGNOSIS — M545 Low back pain: Secondary | ICD-10-CM | POA: Diagnosis not present

## 2017-11-01 DIAGNOSIS — M545 Low back pain: Secondary | ICD-10-CM | POA: Diagnosis not present

## 2017-11-02 DIAGNOSIS — M545 Low back pain: Secondary | ICD-10-CM | POA: Diagnosis not present

## 2017-11-09 DIAGNOSIS — M545 Low back pain: Secondary | ICD-10-CM | POA: Diagnosis not present

## 2017-11-24 DIAGNOSIS — M1712 Unilateral primary osteoarthritis, left knee: Secondary | ICD-10-CM | POA: Diagnosis not present

## 2017-11-24 DIAGNOSIS — M25461 Effusion, right knee: Secondary | ICD-10-CM | POA: Diagnosis not present

## 2017-11-24 DIAGNOSIS — M1711 Unilateral primary osteoarthritis, right knee: Secondary | ICD-10-CM | POA: Diagnosis not present

## 2017-11-24 DIAGNOSIS — M25462 Effusion, left knee: Secondary | ICD-10-CM | POA: Diagnosis not present

## 2017-11-24 DIAGNOSIS — M170 Bilateral primary osteoarthritis of knee: Secondary | ICD-10-CM | POA: Diagnosis not present

## 2017-12-07 DIAGNOSIS — M170 Bilateral primary osteoarthritis of knee: Secondary | ICD-10-CM | POA: Diagnosis not present

## 2017-12-14 DIAGNOSIS — Z85828 Personal history of other malignant neoplasm of skin: Secondary | ICD-10-CM | POA: Diagnosis not present

## 2017-12-14 DIAGNOSIS — L570 Actinic keratosis: Secondary | ICD-10-CM | POA: Diagnosis not present

## 2017-12-14 DIAGNOSIS — D485 Neoplasm of uncertain behavior of skin: Secondary | ICD-10-CM | POA: Diagnosis not present

## 2017-12-14 DIAGNOSIS — M170 Bilateral primary osteoarthritis of knee: Secondary | ICD-10-CM | POA: Diagnosis not present

## 2017-12-19 DIAGNOSIS — Z6834 Body mass index (BMI) 34.0-34.9, adult: Secondary | ICD-10-CM | POA: Diagnosis not present

## 2017-12-19 DIAGNOSIS — I10 Essential (primary) hypertension: Secondary | ICD-10-CM | POA: Diagnosis not present

## 2017-12-19 DIAGNOSIS — M1990 Unspecified osteoarthritis, unspecified site: Secondary | ICD-10-CM | POA: Diagnosis not present

## 2017-12-21 DIAGNOSIS — M170 Bilateral primary osteoarthritis of knee: Secondary | ICD-10-CM | POA: Diagnosis not present

## 2017-12-21 DIAGNOSIS — M1710 Unilateral primary osteoarthritis, unspecified knee: Secondary | ICD-10-CM | POA: Diagnosis not present

## 2018-03-15 DIAGNOSIS — M1712 Unilateral primary osteoarthritis, left knee: Secondary | ICD-10-CM | POA: Diagnosis not present

## 2018-03-22 DIAGNOSIS — M1712 Unilateral primary osteoarthritis, left knee: Secondary | ICD-10-CM | POA: Diagnosis not present

## 2018-03-27 DIAGNOSIS — N200 Calculus of kidney: Secondary | ICD-10-CM | POA: Diagnosis not present

## 2018-03-27 DIAGNOSIS — Z09 Encounter for follow-up examination after completed treatment for conditions other than malignant neoplasm: Secondary | ICD-10-CM | POA: Diagnosis not present

## 2018-03-27 DIAGNOSIS — N401 Enlarged prostate with lower urinary tract symptoms: Secondary | ICD-10-CM | POA: Diagnosis not present

## 2018-03-27 DIAGNOSIS — N400 Enlarged prostate without lower urinary tract symptoms: Secondary | ICD-10-CM | POA: Diagnosis not present

## 2018-03-27 DIAGNOSIS — N4340 Spermatocele of epididymis, unspecified: Secondary | ICD-10-CM | POA: Diagnosis not present

## 2018-03-27 DIAGNOSIS — N138 Other obstructive and reflux uropathy: Secondary | ICD-10-CM | POA: Diagnosis not present

## 2018-03-27 DIAGNOSIS — N323 Diverticulum of bladder: Secondary | ICD-10-CM | POA: Diagnosis not present

## 2018-03-29 DIAGNOSIS — M1712 Unilateral primary osteoarthritis, left knee: Secondary | ICD-10-CM | POA: Diagnosis not present

## 2018-04-27 DIAGNOSIS — Z85828 Personal history of other malignant neoplasm of skin: Secondary | ICD-10-CM | POA: Diagnosis not present

## 2018-04-27 DIAGNOSIS — L986 Other infiltrative disorders of the skin and subcutaneous tissue: Secondary | ICD-10-CM | POA: Diagnosis not present

## 2018-06-12 DIAGNOSIS — R0982 Postnasal drip: Secondary | ICD-10-CM | POA: Diagnosis not present

## 2018-06-12 DIAGNOSIS — H9202 Otalgia, left ear: Secondary | ICD-10-CM | POA: Diagnosis not present

## 2018-06-12 DIAGNOSIS — H6502 Acute serous otitis media, left ear: Secondary | ICD-10-CM | POA: Diagnosis not present

## 2018-06-14 DIAGNOSIS — L570 Actinic keratosis: Secondary | ICD-10-CM | POA: Diagnosis not present

## 2018-06-14 DIAGNOSIS — Z85828 Personal history of other malignant neoplasm of skin: Secondary | ICD-10-CM | POA: Diagnosis not present

## 2018-06-14 DIAGNOSIS — L821 Other seborrheic keratosis: Secondary | ICD-10-CM | POA: Diagnosis not present

## 2018-06-14 DIAGNOSIS — D692 Other nonthrombocytopenic purpura: Secondary | ICD-10-CM | POA: Diagnosis not present

## 2018-06-14 DIAGNOSIS — D1801 Hemangioma of skin and subcutaneous tissue: Secondary | ICD-10-CM | POA: Diagnosis not present

## 2018-06-26 ENCOUNTER — Encounter (INDEPENDENT_AMBULATORY_CARE_PROVIDER_SITE_OTHER): Payer: Self-pay | Admitting: *Deleted

## 2018-06-28 DIAGNOSIS — Z23 Encounter for immunization: Secondary | ICD-10-CM | POA: Diagnosis not present

## 2018-06-30 DIAGNOSIS — H2513 Age-related nuclear cataract, bilateral: Secondary | ICD-10-CM | POA: Diagnosis not present

## 2018-07-10 DIAGNOSIS — Z125 Encounter for screening for malignant neoplasm of prostate: Secondary | ICD-10-CM | POA: Diagnosis not present

## 2018-07-10 DIAGNOSIS — M1990 Unspecified osteoarthritis, unspecified site: Secondary | ICD-10-CM | POA: Diagnosis not present

## 2018-07-10 DIAGNOSIS — Z79899 Other long term (current) drug therapy: Secondary | ICD-10-CM | POA: Diagnosis not present

## 2018-07-10 DIAGNOSIS — I10 Essential (primary) hypertension: Secondary | ICD-10-CM | POA: Diagnosis not present

## 2018-07-10 DIAGNOSIS — G459 Transient cerebral ischemic attack, unspecified: Secondary | ICD-10-CM | POA: Diagnosis not present

## 2018-07-17 DIAGNOSIS — Z8673 Personal history of transient ischemic attack (TIA), and cerebral infarction without residual deficits: Secondary | ICD-10-CM | POA: Diagnosis not present

## 2018-07-17 DIAGNOSIS — M1000 Idiopathic gout, unspecified site: Secondary | ICD-10-CM | POA: Diagnosis not present

## 2018-07-17 DIAGNOSIS — Z6834 Body mass index (BMI) 34.0-34.9, adult: Secondary | ICD-10-CM | POA: Diagnosis not present

## 2018-07-17 DIAGNOSIS — I10 Essential (primary) hypertension: Secondary | ICD-10-CM | POA: Diagnosis not present

## 2018-07-17 DIAGNOSIS — E785 Hyperlipidemia, unspecified: Secondary | ICD-10-CM | POA: Diagnosis not present

## 2018-07-20 ENCOUNTER — Other Ambulatory Visit (INDEPENDENT_AMBULATORY_CARE_PROVIDER_SITE_OTHER): Payer: Self-pay | Admitting: *Deleted

## 2018-07-20 DIAGNOSIS — Z860100 Personal history of colon polyps, unspecified: Secondary | ICD-10-CM | POA: Insufficient documentation

## 2018-07-24 DIAGNOSIS — N200 Calculus of kidney: Secondary | ICD-10-CM | POA: Diagnosis not present

## 2018-07-24 DIAGNOSIS — Z87448 Personal history of other diseases of urinary system: Secondary | ICD-10-CM | POA: Diagnosis not present

## 2018-07-24 DIAGNOSIS — N4340 Spermatocele of epididymis, unspecified: Secondary | ICD-10-CM | POA: Diagnosis not present

## 2018-07-24 DIAGNOSIS — N401 Enlarged prostate with lower urinary tract symptoms: Secondary | ICD-10-CM | POA: Diagnosis not present

## 2018-07-24 DIAGNOSIS — N138 Other obstructive and reflux uropathy: Secondary | ICD-10-CM | POA: Diagnosis not present

## 2018-07-24 DIAGNOSIS — N323 Diverticulum of bladder: Secondary | ICD-10-CM | POA: Diagnosis not present

## 2018-08-02 ENCOUNTER — Encounter (INDEPENDENT_AMBULATORY_CARE_PROVIDER_SITE_OTHER): Payer: Self-pay | Admitting: *Deleted

## 2018-08-02 ENCOUNTER — Telehealth (INDEPENDENT_AMBULATORY_CARE_PROVIDER_SITE_OTHER): Payer: Self-pay | Admitting: *Deleted

## 2018-08-02 NOTE — Telephone Encounter (Signed)
Patient needs trilyte 

## 2018-08-03 MED ORDER — PEG 3350-KCL-NA BICARB-NACL 420 G PO SOLR: 4000.0000 mL | Freq: Once | ORAL | 0 refills | Status: AC

## 2018-08-10 DIAGNOSIS — Z23 Encounter for immunization: Secondary | ICD-10-CM | POA: Diagnosis not present

## 2018-08-13 DIAGNOSIS — N390 Urinary tract infection, site not specified: Secondary | ICD-10-CM | POA: Diagnosis not present

## 2018-08-29 ENCOUNTER — Telehealth (INDEPENDENT_AMBULATORY_CARE_PROVIDER_SITE_OTHER): Payer: Self-pay | Admitting: *Deleted

## 2018-08-29 NOTE — Telephone Encounter (Signed)
agree

## 2018-08-29 NOTE — Telephone Encounter (Signed)
Referring MD/PCP: fagan   Procedure: tcs  Reason/Indication:  Hx polyps  Has patient had this procedure before?  Yes, 2014  If so, when, by whom and where?    Is there a family history of colon cancer?  no  Who?  What age when diagnosed?    Is patient diabetic?   no      Does patient have prosthetic heart valve or mechanical valve?  no  Do you have a pacemaker?  no  Has patient ever had endocarditis? no  Has patient had joint replacement within last 12 months?  no  Is patient constipated or do they take laxatives? no  Does patient have a history of alcohol/drug use?  no  Is patient on blood thinner such as Coumadin, Plavix and/or Aspirin? yes  Medications: asa 81 mg daily, atorvastatin 20 mg daily, amlodipine 5 mg daily  Allergies: sulfur, pcn, prednisone  Medication Adjustment per Dr Lindi Adie, NP: asa 2 days  Procedure date & time: 09/27/18 at 1030

## 2018-09-07 DIAGNOSIS — M1711 Unilateral primary osteoarthritis, right knee: Secondary | ICD-10-CM | POA: Diagnosis not present

## 2018-09-07 DIAGNOSIS — M25561 Pain in right knee: Secondary | ICD-10-CM | POA: Diagnosis not present

## 2018-09-07 DIAGNOSIS — M170 Bilateral primary osteoarthritis of knee: Secondary | ICD-10-CM | POA: Diagnosis not present

## 2018-09-07 DIAGNOSIS — M25461 Effusion, right knee: Secondary | ICD-10-CM | POA: Diagnosis not present

## 2018-09-18 ENCOUNTER — Telehealth (INDEPENDENT_AMBULATORY_CARE_PROVIDER_SITE_OTHER): Payer: Self-pay | Admitting: *Deleted

## 2018-09-18 NOTE — Telephone Encounter (Signed)
Patient's wife called to let us know patient has Alphagal which is allergy to red meat from tick bite and may need to take precautions for anesthesia -- I spoke to nurse in ENDO they are aware and I asked patient to let the nurses know when he goes for his procedure on 09/27/18

## 2018-09-27 ENCOUNTER — Encounter (HOSPITAL_COMMUNITY): Payer: Self-pay | Admitting: *Deleted

## 2018-09-27 ENCOUNTER — Ambulatory Visit (HOSPITAL_COMMUNITY)
Admission: RE | Admit: 2018-09-27 | Discharge: 2018-09-27 | Disposition: A | Discharge status: Home / Self Care | Payer: Medicare Other | Attending: Internal Medicine | Admitting: Internal Medicine

## 2018-09-27 ENCOUNTER — Encounter (HOSPITAL_COMMUNITY)
Admission: RE | Disposition: A | Discharge status: Home / Self Care | Payer: Self-pay | Source: Home / Self Care | Attending: Internal Medicine

## 2018-09-27 ENCOUNTER — Other Ambulatory Visit: Payer: Self-pay

## 2018-09-27 DIAGNOSIS — Z880 Allergy status to penicillin: Secondary | ICD-10-CM | POA: Insufficient documentation

## 2018-09-27 DIAGNOSIS — Z7982 Long term (current) use of aspirin: Secondary | ICD-10-CM | POA: Insufficient documentation

## 2018-09-27 DIAGNOSIS — K5730 Diverticulosis of large intestine without perforation or abscess without bleeding: Secondary | ICD-10-CM | POA: Insufficient documentation

## 2018-09-27 DIAGNOSIS — I10 Essential (primary) hypertension: Secondary | ICD-10-CM | POA: Diagnosis not present

## 2018-09-27 DIAGNOSIS — Z87442 Personal history of urinary calculi: Secondary | ICD-10-CM | POA: Insufficient documentation

## 2018-09-27 DIAGNOSIS — Z8249 Family history of ischemic heart disease and other diseases of the circulatory system: Secondary | ICD-10-CM | POA: Insufficient documentation

## 2018-09-27 DIAGNOSIS — K644 Residual hemorrhoidal skin tags: Secondary | ICD-10-CM | POA: Insufficient documentation

## 2018-09-27 DIAGNOSIS — D125 Benign neoplasm of sigmoid colon: Secondary | ICD-10-CM | POA: Insufficient documentation

## 2018-09-27 DIAGNOSIS — Z860100 Personal history of colon polyps, unspecified: Secondary | ICD-10-CM | POA: Insufficient documentation

## 2018-09-27 DIAGNOSIS — Z882 Allergy status to sulfonamides status: Secondary | ICD-10-CM | POA: Insufficient documentation

## 2018-09-27 DIAGNOSIS — Z09 Encounter for follow-up examination after completed treatment for conditions other than malignant neoplasm: Secondary | ICD-10-CM | POA: Diagnosis not present

## 2018-09-27 DIAGNOSIS — D122 Benign neoplasm of ascending colon: Secondary | ICD-10-CM | POA: Diagnosis not present

## 2018-09-27 DIAGNOSIS — Z79899 Other long term (current) drug therapy: Secondary | ICD-10-CM | POA: Insufficient documentation

## 2018-09-27 DIAGNOSIS — Z888 Allergy status to other drugs, medicaments and biological substances status: Secondary | ICD-10-CM | POA: Insufficient documentation

## 2018-09-27 DIAGNOSIS — Z1211 Encounter for screening for malignant neoplasm of colon: Secondary | ICD-10-CM | POA: Insufficient documentation

## 2018-09-27 HISTORY — PX: POLYPECTOMY: SHX5525

## 2018-09-27 HISTORY — DX: Personal history of urinary calculi: Z87.442

## 2018-09-27 HISTORY — PX: COLONOSCOPY: SHX5424

## 2018-09-27 SURGERY — COLONOSCOPY
Anesthesia: Moderate Sedation

## 2018-09-27 MED ORDER — MIDAZOLAM HCL 5 MG/5ML IJ SOLN
INTRAMUSCULAR | Status: AC
  Filled 2018-09-27: qty 10

## 2018-09-27 MED ORDER — STERILE WATER FOR IRRIGATION IR SOLN
Status: DC | PRN
  Administered 2018-09-27: 1.5 mL

## 2018-09-27 MED ORDER — MIDAZOLAM HCL 5 MG/5ML IJ SOLN
INTRAMUSCULAR | Status: DC | PRN
  Administered 2018-09-27: 2 mg via INTRAVENOUS
  Administered 2018-09-27: 1 mg via INTRAVENOUS
  Administered 2018-09-27: 2 mg via INTRAVENOUS

## 2018-09-27 MED ORDER — MEPERIDINE HCL 50 MG/ML IJ SOLN
INTRAMUSCULAR | Status: AC
  Filled 2018-09-27: qty 1

## 2018-09-27 MED ORDER — SODIUM CHLORIDE 0.9 % IV SOLN
INTRAVENOUS | Status: DC
  Administered 2018-09-27: 1000 mL via INTRAVENOUS

## 2018-09-27 MED ORDER — MEPERIDINE HCL 50 MG/ML IJ SOLN
INTRAMUSCULAR | Status: DC | PRN
  Administered 2018-09-27 (×2): 25 mg via INTRAVENOUS

## 2018-09-27 NOTE — H&P (Signed)
Rodney Wilson is an 78 y.o. male.   Chief Complaint: Patient is here for colonoscopy. HPI: Patient is 78 year old Caucasian male who has a history of colonic adenomas and is here for surveillance colonoscopy.  He denies abdominal pain or change in bowel habits.  He has occasional blood on wiping.  He denies frank rectal bleeding. Last colonoscopy was in September 2014 with removal of 5 small tubular adenomas. Family history is negative for CRC.  Past Medical History:  Diagnosis Date  . History of kidney stones   . Hypertension     Past Surgical History:  Procedure Laterality Date  . APPENDECTOMY    . COLONOSCOPY N/A 07/12/2013   Procedure: COLONOSCOPY;  Surgeon: Rogene Houston, MD;  Location: [facility];  Service: Endoscopy;  Laterality: N/A;  1030-rescheduled to 8:30am  . Kidney stone removal    . UMBILICAL HERNIA REPAIR      Family History  Problem Relation Age of Onset  . Ovarian cancer Mother   . Heart attack Father   . Colon cancer Neg Hx    Social History:  reports that he has never smoked. He has never used smokeless tobacco. He reports that he does not drink alcohol or use drugs.  Allergies:  Allergies  Allergen Reactions  . Other Swelling and Rash    Alpha Gal  . Penicillins Swelling and Rash    Did it involve swelling of the face/tongue/throat, SOB, or low BP? No Did it involve sudden or severe rash/hives, skin peeling, or any reaction on the inside of your mouth or nose? No Did you need to seek medical attention at a hospital or doctor's office? No When did it last happen?20-30 years ago If all above answers are "NO", may proceed with cephalosporin use.   . Prednisone Rash  . Sulfa Antibiotics Swelling and Rash    Medications Prior to Admission  Medication Sig Dispense Refill  . amLODipine (NORVASC) 10 MG tablet Take 10 mg by mouth daily.    Marland Kitchen aspirin EC 81 MG tablet Take 81 mg by mouth daily.    Marland Kitchen atorvastatin (LIPITOR) 20 MG tablet Take 20 mg by  mouth daily.      No results found for this or any previous visit (from the past 48 hour(s)). No results found.  ROS  Blood pressure (!) 147/80, pulse 77, temperature 97.7 F (36.5 C), temperature source Oral, resp. rate 19, height 5\' 10"  (1.778 m), weight 99.8 kg, SpO2 96 %. Physical Exam  Constitutional: He appears well-developed and well-nourished.  HENT:  Mouth/Throat: Oropharynx is clear and moist.  Eyes: Conjunctivae are normal. No scleral icterus.  Neck: No thyromegaly present.  Cardiovascular: Normal rate, regular rhythm and normal heart sounds.  No murmur heard. Respiratory: Effort normal and breath sounds normal.  GI:  Abdomen is full but is soft and nontender with organomegaly or masses.  Musculoskeletal:        General: No edema.  Lymphadenopathy:    He has no cervical adenopathy.  Neurological: He is alert.  Skin: Skin is warm and dry.     Assessment/Plan History of colonic adenomas. Surveillance colonoscopy.    Hildred Laser, MD 09/27/2018, 10:14 AM

## 2018-09-27 NOTE — Discharge Instructions (Signed)
No aspirin or NSAIDs for 24 hours. Resume other medications as before High-fiber diet. No driving for 24 hours. Physician will call with biopsy results.      Colonoscopy, Adult, Care After This sheet gives you information about how to care for yourself after your procedure. Your doctor may also give you more specific instructions. If you have problems or questions, call your doctor. What can I expect after the procedure? After the procedure, it is common to have:  A small amount of blood in your poop for 24 hours.  Some gas.  Mild cramping or bloating in your belly. Follow these instructions at home: General instructions  For the first 24 hours after the procedure: ? Do not drive or use machinery. ? Do not sign important documents. ? Do not drink alcohol. ? Do your daily activities more slowly than normal. ? Eat foods that are soft and easy to digest.  Take over-the-counter or prescription medicines only as told by your doctor. To help cramping and bloating:   Try walking around.  Put heat on your belly (abdomen) as told by your doctor. Use a heat source that your doctor recommends, such as a moist heat pack or a heating pad. ? Put a towel between your skin and the heat source. ? Leave the heat on for 20-30 minutes. ? Remove the heat if your skin turns bright red. This is especially important if you cannot feel pain, heat, or cold. You can get burned. Eating and drinking   Drink enough fluid to keep your pee (urine) clear or pale yellow.  Return to your normal diet as told by your doctor. Avoid heavy or fried foods that are hard to digest.  Avoid drinking alcohol for as long as told by your doctor. Contact a doctor if:  You have blood in your poop (stool) 2-3 days after the procedure. Get help right away if:  You have more than a small amount of blood in your poop.  You see large clumps of tissue (blood clots) in your poop.  Your belly is swollen.  You feel  sick to your stomach (nauseous).  You throw up (vomit).  You have a fever.  You have belly pain that gets worse, and medicine does not help your pain. Summary  After the procedure, it is common to have a small amount of blood in your poop. You may also have mild cramping and bloating in your belly.  For the first 24 hours after the procedure, do not drive or use machinery, do not sign important documents, and do not drink alcohol.  Get help right away if you have a lot of blood in your poop, feel sick to your stomach, have a fever, or have more belly pain. This information is not intended to replace advice given to you by your health care provider. Make sure you discuss any questions you have with your health care provider. Document Released: 09/18/2010 Document Revised: 06/16/2017 Document Reviewed: 05/10/2016 Elsevier Interactive Patient Education  2019 [employer].    Diverticulosis  Diverticulosis is a condition that develops when small pouches (diverticula) form in the wall of the large intestine (colon). The colon is where water is absorbed and stool is formed. The pouches form when the inside layer of the colon pushes through weak spots in the outer layers of the colon. You may have a few pouches or many of them. What are the causes? The cause of this condition is not known. What increases the risk?  The following factors may make you more likely to develop this condition:  Being older than age 71. Your risk for this condition increases with age. Diverticulosis is rare among people younger than age 70. By age 32, many people have it.  Eating a low-fiber diet.  Having frequent constipation.  Being overweight.  Not getting enough exercise.  Smoking.  Taking over-the-counter pain medicines, like aspirin and ibuprofen.  Having a family history of diverticulosis. What are the signs or symptoms? In most people, there are no symptoms of this condition. If you do have  symptoms, they may include:  Bloating.  Cramps in the abdomen.  Constipation or diarrhea.  Pain in the lower left side of the abdomen. How is this diagnosed? This condition is most often diagnosed during an exam for other colon problems. Because diverticulosis usually has no symptoms, it often cannot be diagnosed independently. This condition may be diagnosed by:  Using a flexible scope to examine the colon (colonoscopy).  Taking an X-ray of the colon after dye has been put into the colon (barium enema).  Doing a CT scan. How is this treated? You may not need treatment for this condition if you have never developed an infection related to diverticulosis. If you have had an infection before, treatment may include:  Eating a high-fiber diet. This may include eating more fruits, vegetables, and grains.  Taking a fiber supplement.  Taking a live bacteria supplement (probiotic).  Taking medicine to relax your colon.  Taking antibiotic medicines. Follow these instructions at home:  Drink 6-8 glasses of water or more each day to prevent constipation.  Try not to strain when you have a bowel movement.  If you have had an infection before: ? Eat more fiber as directed by your health care provider or your diet and nutrition specialist (dietitian). ? Take a fiber supplement or probiotic, if your health care provider approves.  Take over-the-counter and prescription medicines only as told by your health care provider.  If you were prescribed an antibiotic, take it as told by your health care provider. Do not stop taking the antibiotic even if you start to feel better.  Keep all follow-up visits as told by your health care provider. This is important. Contact a health care provider if:  You have pain in your abdomen.  You have bloating.  You have cramps.  You have not had a bowel movement in 3 days. Get help right away if:  Your pain gets worse.  Your bloating becomes very  bad.  You have a fever or chills, and your symptoms suddenly get worse.  You vomit.  You have bowel movements that are bloody or black.  You have bleeding from your rectum. Summary  Diverticulosis is a condition that develops when small pouches (diverticula) form in the wall of the large intestine (colon).  You may have a few pouches or many of them.  This condition is most often diagnosed during an exam for other colon problems.  If you have had an infection related to diverticulosis, treatment may include increasing the fiber in your diet, taking supplements, or taking medicines. This information is not intended to replace advice given to you by your health care provider. Make sure you discuss any questions you have with your health care provider. Document Released: 05/13/2004 Document Revised: 07/05/2016 Document Reviewed: 07/05/2016 Elsevier Interactive Patient Education  2019 [employer]     High-Fiber Diet Fiber, also called dietary fiber, is a type of carbohydrate that  is found in fruits, vegetables, whole grains, and beans. A high-fiber diet can have many health benefits. Your health care provider may recommend a high-fiber diet to help:  Prevent constipation. Fiber can make your bowel movements more regular.  Lower your cholesterol.  Relieve the following conditions: ? Swelling of veins in the anus (hemorrhoids). ? Swelling and irritation (inflammation) of specific areas of the digestive tract (uncomplicated diverticulosis). ? A problem of the large intestine (colon) that sometimes causes pain and diarrhea (irritable bowel syndrome, IBS).  Prevent overeating as part of a weight-loss plan.  Prevent heart disease, type 2 diabetes, and certain cancers. What is my plan? The recommended daily fiber intake in grams (g) includes:  38 g for men age 64 or younger.  30 g for men over age 102.  35 g for women age 60 or younger.  21 g for women over age 8. You can  get the recommended daily intake of dietary fiber by:  Eating a variety of fruits, vegetables, grains, and beans.  Taking a fiber supplement, if it is not possible to get enough fiber through your diet. What do I need to know about a high-fiber diet?  It is better to get fiber through food sources rather than from fiber supplements. There is not a lot of research about how effective supplements are.  Always check the fiber content on the nutrition facts label of any prepackaged food. Look for foods that contain 5 g of fiber or more per serving.  Talk with a diet and nutrition specialist (dietitian) if you have questions about specific foods that are recommended or not recommended for your medical condition, especially if those foods are not listed below.  Gradually increase how much fiber you consume. If you increase your intake of dietary fiber too quickly, you may have bloating, cramping, or gas.  Drink plenty of water. Water helps you to digest fiber. What are tips for following this plan?  Eat a wide variety of high-fiber foods.  Make sure that half of the grains that you eat each day are whole grains.  Eat breads and cereals that are made with whole-grain flour instead of refined flour or white flour.  Eat brown rice, bulgur wheat, or millet instead of white rice.  Start the day with a breakfast that is high in fiber, such as a cereal that contains 5 g of fiber or more per serving.  Use beans in place of meat in soups, salads, and pasta dishes.  Eat high-fiber snacks, such as berries, raw vegetables, nuts, and popcorn.  Choose whole fruits and vegetables instead of processed forms like juice or sauce. What foods can I eat?  Fruits Berries. Pears. Apples. Oranges. Avocado. Prunes and raisins. Dried figs. Vegetables Sweet potatoes. Spinach. Kale. Artichokes. Cabbage. Broccoli. Cauliflower. Green peas. Carrots. Squash. Grains Whole-grain breads. Multigrain cereal. Oats and  oatmeal. Brown rice. Barley. Bulgur wheat. Avilla. Quinoa. Bran muffins. Popcorn. Rye wafer crackers. Meats and other proteins Navy, kidney, and pinto beans. Soybeans. Split peas. Lentils. Nuts and seeds. Dairy Fiber-fortified yogurt. Beverages Fiber-fortified soy milk. Fiber-fortified orange juice. Other foods Fiber bars. The items listed above may not be a complete list of recommended foods and beverages. Contact a dietitian for more options. What foods are not recommended? Fruits Fruit juice. Cooked, strained fruit. Vegetables Fried potatoes. Canned vegetables. Well-cooked vegetables. Grains White bread. Pasta made with refined flour. White rice. Meats and other proteins Fatty cuts of meat. Fried chicken or fried fish. Dairy Milk.  Yogurt. Cream cheese. Sour cream. Fats and oils Butters. Beverages Soft drinks. Other foods Cakes and pastries. The items listed above may not be a complete list of foods and beverages to avoid. Contact a dietitian for more information. Summary  Fiber is a type of carbohydrate. It is found in fruits, vegetables, whole grains, and beans.  There are many health benefits of eating a high-fiber diet, such as preventing constipation, lowering blood cholesterol, helping with weight loss, and reducing your risk of heart disease, diabetes, and certain cancers.  Gradually increase your intake of fiber. Increasing too fast can result in cramping, bloating, and gas. Drink plenty of water while you increase your fiber.  The best sources of fiber include whole fruits and vegetables, whole grains, nuts, seeds, and beans. This information is not intended to replace advice given to you by your health care provider. Make sure you discuss any questions you have with your health care provider. Document Released: 08/16/2005 Document Revised: 06/20/2017 Document Reviewed: 06/20/2017 Elsevier Interactive Patient Education  2019 [employer]    Colon  Polyps  Polyps are tissue growths inside the body. Polyps can grow in many places, including the large intestine (colon). A polyp may be a round bump or a mushroom-shaped growth. You could have one polyp or several. Most colon polyps are noncancerous (benign). However, some colon polyps can become cancerous over time. Finding and removing the polyps early can help prevent this. What are the causes? The exact cause of colon polyps is not known. What increases the risk? You are more likely to develop this condition if you:  Have a family history of colon cancer or colon polyps.  Are older than 71 or older than 45 if you are African American.  Have inflammatory bowel disease, such as ulcerative colitis or Crohn's disease.  Have certain hereditary conditions, such as: ? Familial adenomatous polyposis. ? Lynch syndrome. ? Turcot syndrome. ? Peutz-Jeghers syndrome.  Are overweight.  Smoke cigarettes.  Do not get enough exercise.  Drink too much alcohol.  Eat a diet that is high in fat and red meat and low in fiber.  Had childhood cancer that was treated with abdominal radiation. What are the signs or symptoms? Most polyps do not cause symptoms. If you have symptoms, they may include:  Blood coming from your rectum when having a bowel movement.  Blood in your stool. The stool may look dark red or black.  Abdominal pain.  A change in bowel habits, such as constipation or diarrhea. How is this diagnosed? This condition is diagnosed with a colonoscopy. This is a procedure in which a lighted, flexible scope is inserted into the anus and then passed into the colon to examine the area. Polyps are sometimes found when a colonoscopy is done as part of routine cancer screening tests. How is this treated? Treatment for this condition involves removing any polyps that are found. Most polyps can be removed during a colonoscopy. Those polyps will then be tested for cancer. Additional  treatment may be needed depending on the results of testing. Follow these instructions at home: Lifestyle  Maintain a healthy weight, or lose weight if recommended by your health care provider.  Exercise every day or as told by your health care provider.  Do not use any products that contain nicotine or tobacco, such as cigarettes and e-cigarettes. If you need help quitting, ask your health care provider.  If you drink alcohol, limit how much you have: ? 0-1 drink a day  for women. ? 0-2 drinks a day for men.  Be aware of how much alcohol is in your drink. In the [place] one drink equals one 12 oz bottle of beer (355 mL), one 5 oz glass of wine (148 mL), or one 1 oz shot of hard liquor (44 mL). Eating and drinking   Eat foods that are high in fiber, such as fruits, vegetables, and whole grains.  Eat foods that are high in calcium and vitamin D, such as milk, cheese, yogurt, eggs, liver, fish, and broccoli.  Limit foods that are high in fat, such as fried foods and desserts.  Limit the amount of red meat and processed meat you eat, such as hot dogs, sausage, bacon, and lunch meats. General instructions  Keep all follow-up visits as told by your health care provider. This is important. ? This includes having regularly scheduled colonoscopies. ? Talk to your health care provider about when you need a colonoscopy. Contact a health care provider if:  You have new or worsening bleeding during a bowel movement.  You have new or increased blood in your stool.  You have a change in bowel habits.  You lose weight for no known reason. Summary  Polyps are tissue growths inside the body. Polyps can grow in many places, including the colon.  Most colon polyps are noncancerous (benign), but some can become cancerous over time.  This condition is diagnosed with a colonoscopy.  Treatment for this condition involves removing any polyps that are found. Most polyps can be removed during a  colonoscopy. This information is not intended to replace advice given to you by your health care provider. Make sure you discuss any questions you have with your health care provider. Document Released: 05/12/2004 Document Revised: 12/01/2017 Document Reviewed: 12/01/2017 Elsevier Interactive Patient Education  2019 [employer].

## 2018-09-27 NOTE — Op Note (Signed)
[facility] Patient Name: Rodney Wilson Procedure Date: 09/27/2018 9:53 AM MRN: [id number] Date of Birth: 01-01-1941 Attending MD: Hildred Laser , MD CSN: [id number] Age: 78 Admit Type: Outpatient Procedure:                Colonoscopy Indications:              High risk colon cancer surveillance: Personal                            history of colonic polyps Providers:                Hildred Laser, MD, Charlsie Quest. Theda Sers RN, RN, Aram Candela Referring MD:             Asencion Noble, MD Medicines:                Meperidine 50 mg IV, Midazolam 5 mg IV Complications:            No immediate complications. Estimated Blood Loss:     Estimated blood loss was minimal. Procedure:                Pre-Anesthesia Assessment:                           - Prior to the procedure, a History and Physical                            was performed, and patient medications and                            allergies were reviewed. The patient's tolerance of                            previous anesthesia was also reviewed. The risks                            and benefits of the procedure and the sedation                            options and risks were discussed with the patient.                            All questions were answered, and informed consent                            was obtained. Prior Anticoagulants: The patient                            last took aspirin 7 days prior to the procedure.                            ASA Grade Assessment: II - A patient with mild  systemic disease. After reviewing the risks and                            benefits, the patient was deemed in satisfactory                            condition to undergo the procedure.                           After obtaining informed consent, the colonoscope                            was passed under direct vision. Throughout the                            procedure, the  patient's blood pressure, pulse, and                            oxygen saturations were monitored continuously. The                            [id number]-[id number] ([id number]) scope was introduced through                            the anus and advanced to the the cecum, identified                            by appendiceal orifice and ileocecal valve. The                            colonoscopy was performed without difficulty. The                            patient tolerated the procedure well. The quality                            of the bowel preparation was good. The ileocecal                            valve, appendiceal orifice, and rectum were                            photographed. Scope In: 10:24:50 AM Scope Out: 10:51:06 AM Scope Withdrawal Time: 0 hours 20 minutes 29 seconds  Total Procedure Duration: 0 hours 26 minutes 16 seconds  Findings:      The perianal and digital rectal examinations were normal.      Three sessile polyps were found in the sigmoid colon and ascending       colon. The polyps were small in size. These polyps were removed with a       cold snare. Resection and retrieval were complete. The pathology       specimen was placed into Bottle Number 1.      A small polyp was found in the proximal ascending colon. The polyp was  sessile. Biopsies were taken with a cold forceps for histology. The       pathology specimen was placed into Bottle Number 1.      Scattered medium-mouthed diverticula were found in the sigmoid colon.      External hemorrhoids were found during retroflexion. The hemorrhoids       were small. Impression:               - Three small polyps in the sigmoid colon and in                            the ascending colon, removed with a cold snare.                            Resected and retrieved.                           - One small polyp in the proximal ascending colon.                            Biopsied.                           - Diverticulosis  in the sigmoid colon.                           - External hemorrhoids. Moderate Sedation:      Moderate (conscious) sedation was administered by the endoscopy nurse       and supervised by the endoscopist. The following parameters were       monitored: oxygen saturation, heart rate, blood pressure, CO2       capnography and response to care. Total physician intraservice time was       33 minutes. Recommendation:           - Patient has a contact number available for                            emergencies. The signs and symptoms of potential                            delayed complications were discussed with the                            patient. Return to normal activities tomorrow.                            Written discharge instructions were provided to the                            patient.                           - High fiber diet today.                           - Continue present medications.                           -  No aspirin, ibuprofen, naproxen, or other                            non-steroidal anti-inflammatory drugs for 1 day.                           - Await pathology results.                           - Repeat colonoscopy is recommended. The                            colonoscopy date will be determined after pathology                            results from today's exam become available for                            review. Procedure Code(s):        --- Professional ---                           [phone], Colonoscopy, flexible; with removal of                            tumor(s), polyp(s), or other lesion(s) by snare                            technique                           45380, 59, Colonoscopy, flexible; with biopsy,                            single or multiple                           99153, Moderate sedation; each additional 15                            minutes intraservice time                           G0500, Moderate sedation services provided by  the                            same physician or other qualified health care                            professional performing a gastrointestinal                            endoscopic service that sedation supports,                            requiring the presence of an independent trained  observer to assist in the monitoring of the                            patient's level of consciousness and physiological                            status; initial 15 minutes of intra-service time;                            patient age 24 years or older (additional time may                            be reported with [phone], as appropriate) Diagnosis Code(s):        --- Professional ---                           Z86.010, Personal history of colonic polyps                           D12.5, Benign neoplasm of sigmoid colon                           D12.2, Benign neoplasm of ascending colon                           K64.4, Residual hemorrhoidal skin tags                           K57.30, Diverticulosis of large intestine without                            perforation or abscess without bleeding CPT copyright 2018 [employer]. All rights reserved. The codes documented in this report are preliminary and upon coder review may  be revised to meet current compliance requirements. Hildred Laser, MD Hildred Laser, MD 09/27/2018 11:01:48 AM This report has been signed electronically. Number of Addenda: 0

## 2018-09-29 ENCOUNTER — Encounter (HOSPITAL_COMMUNITY): Payer: Self-pay | Admitting: Internal Medicine

## 2018-10-06 DIAGNOSIS — N200 Calculus of kidney: Secondary | ICD-10-CM | POA: Diagnosis not present

## 2018-10-06 DIAGNOSIS — N138 Other obstructive and reflux uropathy: Secondary | ICD-10-CM | POA: Diagnosis not present

## 2018-10-06 DIAGNOSIS — N401 Enlarged prostate with lower urinary tract symptoms: Secondary | ICD-10-CM | POA: Diagnosis not present

## 2018-10-06 DIAGNOSIS — R300 Dysuria: Secondary | ICD-10-CM | POA: Diagnosis not present

## 2018-10-06 DIAGNOSIS — N4340 Spermatocele of epididymis, unspecified: Secondary | ICD-10-CM | POA: Diagnosis not present

## 2018-10-06 DIAGNOSIS — N323 Diverticulum of bladder: Secondary | ICD-10-CM | POA: Diagnosis not present

## 2018-10-06 DIAGNOSIS — R339 Retention of urine, unspecified: Secondary | ICD-10-CM | POA: Diagnosis not present

## 2018-10-11 DIAGNOSIS — L509 Urticaria, unspecified: Secondary | ICD-10-CM | POA: Diagnosis not present

## 2018-10-11 DIAGNOSIS — L272 Dermatitis due to ingested food: Secondary | ICD-10-CM | POA: Diagnosis not present

## 2018-10-16 DIAGNOSIS — Z6834 Body mass index (BMI) 34.0-34.9, adult: Secondary | ICD-10-CM | POA: Diagnosis not present

## 2018-10-16 DIAGNOSIS — N401 Enlarged prostate with lower urinary tract symptoms: Secondary | ICD-10-CM | POA: Diagnosis not present

## 2018-10-16 DIAGNOSIS — I10 Essential (primary) hypertension: Secondary | ICD-10-CM | POA: Diagnosis not present

## 2018-10-31 DIAGNOSIS — R3914 Feeling of incomplete bladder emptying: Secondary | ICD-10-CM | POA: Diagnosis not present

## 2018-10-31 DIAGNOSIS — N401 Enlarged prostate with lower urinary tract symptoms: Secondary | ICD-10-CM | POA: Diagnosis not present

## 2018-12-25 DIAGNOSIS — L821 Other seborrheic keratosis: Secondary | ICD-10-CM | POA: Diagnosis not present

## 2018-12-25 DIAGNOSIS — Z85828 Personal history of other malignant neoplasm of skin: Secondary | ICD-10-CM | POA: Diagnosis not present

## 2018-12-25 DIAGNOSIS — D1801 Hemangioma of skin and subcutaneous tissue: Secondary | ICD-10-CM | POA: Diagnosis not present

## 2018-12-25 DIAGNOSIS — D225 Melanocytic nevi of trunk: Secondary | ICD-10-CM | POA: Diagnosis not present

## 2018-12-25 DIAGNOSIS — L570 Actinic keratosis: Secondary | ICD-10-CM | POA: Diagnosis not present

## 2019-01-23 DIAGNOSIS — T7840XA Allergy, unspecified, initial encounter: Secondary | ICD-10-CM | POA: Diagnosis not present

## 2019-01-23 DIAGNOSIS — I10 Essential (primary) hypertension: Secondary | ICD-10-CM | POA: Diagnosis not present

## 2019-05-31 DIAGNOSIS — Z23 Encounter for immunization: Secondary | ICD-10-CM | POA: Diagnosis not present

## 2019-06-27 DIAGNOSIS — L570 Actinic keratosis: Secondary | ICD-10-CM | POA: Diagnosis not present

## 2019-06-27 DIAGNOSIS — D692 Other nonthrombocytopenic purpura: Secondary | ICD-10-CM | POA: Diagnosis not present

## 2019-06-27 DIAGNOSIS — C44311 Basal cell carcinoma of skin of nose: Secondary | ICD-10-CM | POA: Diagnosis not present

## 2019-06-27 DIAGNOSIS — D485 Neoplasm of uncertain behavior of skin: Secondary | ICD-10-CM | POA: Diagnosis not present

## 2019-06-27 DIAGNOSIS — Z85828 Personal history of other malignant neoplasm of skin: Secondary | ICD-10-CM | POA: Diagnosis not present

## 2019-06-27 DIAGNOSIS — L821 Other seborrheic keratosis: Secondary | ICD-10-CM | POA: Diagnosis not present

## 2019-06-27 DIAGNOSIS — D1801 Hemangioma of skin and subcutaneous tissue: Secondary | ICD-10-CM | POA: Diagnosis not present

## 2019-06-27 DIAGNOSIS — D2261 Melanocytic nevi of right upper limb, including shoulder: Secondary | ICD-10-CM | POA: Diagnosis not present

## 2019-07-23 DIAGNOSIS — Z79899 Other long term (current) drug therapy: Secondary | ICD-10-CM | POA: Diagnosis not present

## 2019-07-23 DIAGNOSIS — Z8673 Personal history of transient ischemic attack (TIA), and cerebral infarction without residual deficits: Secondary | ICD-10-CM | POA: Diagnosis not present

## 2019-07-23 DIAGNOSIS — I10 Essential (primary) hypertension: Secondary | ICD-10-CM | POA: Diagnosis not present

## 2019-07-23 DIAGNOSIS — M1990 Unspecified osteoarthritis, unspecified site: Secondary | ICD-10-CM | POA: Diagnosis not present

## 2019-07-23 DIAGNOSIS — E785 Hyperlipidemia, unspecified: Secondary | ICD-10-CM | POA: Diagnosis not present

## 2019-07-23 DIAGNOSIS — M109 Gout, unspecified: Secondary | ICD-10-CM | POA: Diagnosis not present

## 2019-07-23 DIAGNOSIS — Z125 Encounter for screening for malignant neoplasm of prostate: Secondary | ICD-10-CM | POA: Diagnosis not present

## 2019-07-30 DIAGNOSIS — I10 Essential (primary) hypertension: Secondary | ICD-10-CM | POA: Diagnosis not present

## 2019-07-30 DIAGNOSIS — E785 Hyperlipidemia, unspecified: Secondary | ICD-10-CM | POA: Diagnosis not present

## 2019-07-30 DIAGNOSIS — Z8673 Personal history of transient ischemic attack (TIA), and cerebral infarction without residual deficits: Secondary | ICD-10-CM | POA: Diagnosis not present

## 2019-08-01 DIAGNOSIS — Z85828 Personal history of other malignant neoplasm of skin: Secondary | ICD-10-CM | POA: Diagnosis not present

## 2019-08-01 DIAGNOSIS — C44321 Squamous cell carcinoma of skin of nose: Secondary | ICD-10-CM | POA: Diagnosis not present

## 2019-08-09 DIAGNOSIS — M7121 Synovial cyst of popliteal space [Baker], right knee: Secondary | ICD-10-CM | POA: Diagnosis not present

## 2019-09-24 DIAGNOSIS — M11261 Other chondrocalcinosis, right knee: Secondary | ICD-10-CM | POA: Diagnosis not present

## 2019-09-24 DIAGNOSIS — M7121 Synovial cyst of popliteal space [Baker], right knee: Secondary | ICD-10-CM | POA: Diagnosis not present

## 2019-09-24 DIAGNOSIS — M11262 Other chondrocalcinosis, left knee: Secondary | ICD-10-CM | POA: Diagnosis not present

## 2019-09-24 DIAGNOSIS — M1711 Unilateral primary osteoarthritis, right knee: Secondary | ICD-10-CM | POA: Diagnosis not present

## 2019-09-24 DIAGNOSIS — M25562 Pain in left knee: Secondary | ICD-10-CM | POA: Diagnosis not present

## 2019-09-24 DIAGNOSIS — M1712 Unilateral primary osteoarthritis, left knee: Secondary | ICD-10-CM | POA: Diagnosis not present

## 2019-09-24 DIAGNOSIS — M170 Bilateral primary osteoarthritis of knee: Secondary | ICD-10-CM | POA: Diagnosis not present

## 2019-09-24 DIAGNOSIS — M25561 Pain in right knee: Secondary | ICD-10-CM | POA: Diagnosis not present

## 2019-09-24 DIAGNOSIS — Z96651 Presence of right artificial knee joint: Secondary | ICD-10-CM | POA: Diagnosis not present

## 2019-09-24 DIAGNOSIS — M7122 Synovial cyst of popliteal space [Baker], left knee: Secondary | ICD-10-CM | POA: Diagnosis not present

## 2019-10-05 DIAGNOSIS — Z23 Encounter for immunization: Secondary | ICD-10-CM | POA: Diagnosis not present

## 2019-10-30 DIAGNOSIS — R972 Elevated prostate specific antigen [PSA]: Secondary | ICD-10-CM | POA: Diagnosis not present

## 2019-11-06 DIAGNOSIS — R3914 Feeling of incomplete bladder emptying: Secondary | ICD-10-CM | POA: Diagnosis not present

## 2019-11-06 DIAGNOSIS — N401 Enlarged prostate with lower urinary tract symptoms: Secondary | ICD-10-CM | POA: Diagnosis not present

## 2019-11-14 DIAGNOSIS — Z01818 Encounter for other preprocedural examination: Secondary | ICD-10-CM | POA: Diagnosis not present

## 2019-11-14 DIAGNOSIS — Z01812 Encounter for preprocedural laboratory examination: Secondary | ICD-10-CM | POA: Diagnosis not present

## 2019-11-14 DIAGNOSIS — Z5181 Encounter for therapeutic drug level monitoring: Secondary | ICD-10-CM | POA: Diagnosis not present

## 2019-11-14 DIAGNOSIS — Z7901 Long term (current) use of anticoagulants: Secondary | ICD-10-CM | POA: Diagnosis not present

## 2019-11-14 DIAGNOSIS — M1711 Unilateral primary osteoarthritis, right knee: Secondary | ICD-10-CM | POA: Diagnosis not present

## 2019-11-14 DIAGNOSIS — M25561 Pain in right knee: Secondary | ICD-10-CM | POA: Diagnosis not present

## 2019-11-17 DIAGNOSIS — Z20822 Contact with and (suspected) exposure to covid-19: Secondary | ICD-10-CM | POA: Diagnosis not present

## 2019-11-17 DIAGNOSIS — Z01812 Encounter for preprocedural laboratory examination: Secondary | ICD-10-CM | POA: Diagnosis not present

## 2019-11-24 DIAGNOSIS — Z01812 Encounter for preprocedural laboratory examination: Secondary | ICD-10-CM | POA: Diagnosis not present

## 2019-11-24 DIAGNOSIS — Z20822 Contact with and (suspected) exposure to covid-19: Secondary | ICD-10-CM | POA: Diagnosis not present

## 2019-11-26 DIAGNOSIS — N189 Chronic kidney disease, unspecified: Secondary | ICD-10-CM | POA: Diagnosis present

## 2019-11-26 DIAGNOSIS — Z01812 Encounter for preprocedural laboratory examination: Secondary | ICD-10-CM | POA: Diagnosis not present

## 2019-11-26 DIAGNOSIS — E785 Hyperlipidemia, unspecified: Secondary | ICD-10-CM | POA: Diagnosis present

## 2019-11-26 DIAGNOSIS — Z6835 Body mass index (BMI) 35.0-35.9, adult: Secondary | ICD-10-CM | POA: Diagnosis not present

## 2019-11-26 DIAGNOSIS — Z7982 Long term (current) use of aspirin: Secondary | ICD-10-CM | POA: Diagnosis not present

## 2019-11-26 DIAGNOSIS — Z87442 Personal history of urinary calculi: Secondary | ICD-10-CM | POA: Diagnosis not present

## 2019-11-26 DIAGNOSIS — G8918 Other acute postprocedural pain: Secondary | ICD-10-CM | POA: Diagnosis not present

## 2019-11-26 DIAGNOSIS — Z888 Allergy status to other drugs, medicaments and biological substances status: Secondary | ICD-10-CM | POA: Diagnosis not present

## 2019-11-26 DIAGNOSIS — Z880 Allergy status to penicillin: Secondary | ICD-10-CM | POA: Diagnosis not present

## 2019-11-26 DIAGNOSIS — Z20822 Contact with and (suspected) exposure to covid-19: Secondary | ICD-10-CM | POA: Diagnosis present

## 2019-11-26 DIAGNOSIS — Z882 Allergy status to sulfonamides status: Secondary | ICD-10-CM | POA: Diagnosis not present

## 2019-11-26 DIAGNOSIS — Z96651 Presence of right artificial knee joint: Secondary | ICD-10-CM | POA: Diagnosis not present

## 2019-11-26 DIAGNOSIS — Z9089 Acquired absence of other organs: Secondary | ICD-10-CM | POA: Diagnosis not present

## 2019-11-26 DIAGNOSIS — M1711 Unilateral primary osteoarthritis, right knee: Secondary | ICD-10-CM | POA: Diagnosis not present

## 2019-11-26 DIAGNOSIS — Z86718 Personal history of other venous thrombosis and embolism: Secondary | ICD-10-CM | POA: Diagnosis not present

## 2019-11-26 DIAGNOSIS — I129 Hypertensive chronic kidney disease with stage 1 through stage 4 chronic kidney disease, or unspecified chronic kidney disease: Secondary | ICD-10-CM | POA: Diagnosis present

## 2019-11-26 DIAGNOSIS — E669 Obesity, unspecified: Secondary | ICD-10-CM | POA: Diagnosis present

## 2019-11-26 DIAGNOSIS — Z8739 Personal history of other diseases of the musculoskeletal system and connective tissue: Secondary | ICD-10-CM | POA: Diagnosis not present

## 2019-11-26 DIAGNOSIS — Z79899 Other long term (current) drug therapy: Secondary | ICD-10-CM | POA: Diagnosis not present

## 2019-11-26 DIAGNOSIS — M25561 Pain in right knee: Secondary | ICD-10-CM | POA: Diagnosis not present

## 2019-11-30 DIAGNOSIS — Z471 Aftercare following joint replacement surgery: Secondary | ICD-10-CM | POA: Diagnosis not present

## 2019-11-30 DIAGNOSIS — I10 Essential (primary) hypertension: Secondary | ICD-10-CM | POA: Diagnosis not present

## 2019-11-30 DIAGNOSIS — M1990 Unspecified osteoarthritis, unspecified site: Secondary | ICD-10-CM | POA: Diagnosis not present

## 2019-11-30 DIAGNOSIS — M109 Gout, unspecified: Secondary | ICD-10-CM | POA: Diagnosis not present

## 2019-11-30 DIAGNOSIS — Z96651 Presence of right artificial knee joint: Secondary | ICD-10-CM | POA: Diagnosis not present

## 2019-12-04 DIAGNOSIS — M109 Gout, unspecified: Secondary | ICD-10-CM | POA: Diagnosis not present

## 2019-12-04 DIAGNOSIS — M1990 Unspecified osteoarthritis, unspecified site: Secondary | ICD-10-CM | POA: Diagnosis not present

## 2019-12-04 DIAGNOSIS — I10 Essential (primary) hypertension: Secondary | ICD-10-CM | POA: Diagnosis not present

## 2019-12-04 DIAGNOSIS — Z96651 Presence of right artificial knee joint: Secondary | ICD-10-CM | POA: Diagnosis not present

## 2019-12-04 DIAGNOSIS — Z471 Aftercare following joint replacement surgery: Secondary | ICD-10-CM | POA: Diagnosis not present

## 2019-12-06 DIAGNOSIS — M109 Gout, unspecified: Secondary | ICD-10-CM | POA: Diagnosis not present

## 2019-12-06 DIAGNOSIS — I10 Essential (primary) hypertension: Secondary | ICD-10-CM | POA: Diagnosis not present

## 2019-12-06 DIAGNOSIS — Z471 Aftercare following joint replacement surgery: Secondary | ICD-10-CM | POA: Diagnosis not present

## 2019-12-06 DIAGNOSIS — M1990 Unspecified osteoarthritis, unspecified site: Secondary | ICD-10-CM | POA: Diagnosis not present

## 2019-12-06 DIAGNOSIS — Z96651 Presence of right artificial knee joint: Secondary | ICD-10-CM | POA: Diagnosis not present

## 2019-12-11 DIAGNOSIS — M109 Gout, unspecified: Secondary | ICD-10-CM | POA: Diagnosis not present

## 2019-12-11 DIAGNOSIS — M1990 Unspecified osteoarthritis, unspecified site: Secondary | ICD-10-CM | POA: Diagnosis not present

## 2019-12-11 DIAGNOSIS — Z96651 Presence of right artificial knee joint: Secondary | ICD-10-CM | POA: Diagnosis not present

## 2019-12-11 DIAGNOSIS — Z471 Aftercare following joint replacement surgery: Secondary | ICD-10-CM | POA: Diagnosis not present

## 2019-12-11 DIAGNOSIS — I10 Essential (primary) hypertension: Secondary | ICD-10-CM | POA: Diagnosis not present

## 2019-12-14 DIAGNOSIS — Z96651 Presence of right artificial knee joint: Secondary | ICD-10-CM | POA: Diagnosis not present

## 2019-12-14 DIAGNOSIS — M1990 Unspecified osteoarthritis, unspecified site: Secondary | ICD-10-CM | POA: Diagnosis not present

## 2019-12-14 DIAGNOSIS — I10 Essential (primary) hypertension: Secondary | ICD-10-CM | POA: Diagnosis not present

## 2019-12-14 DIAGNOSIS — M109 Gout, unspecified: Secondary | ICD-10-CM | POA: Diagnosis not present

## 2019-12-14 DIAGNOSIS — Z471 Aftercare following joint replacement surgery: Secondary | ICD-10-CM | POA: Diagnosis not present

## 2019-12-18 DIAGNOSIS — I10 Essential (primary) hypertension: Secondary | ICD-10-CM | POA: Diagnosis not present

## 2019-12-18 DIAGNOSIS — Z471 Aftercare following joint replacement surgery: Secondary | ICD-10-CM | POA: Diagnosis not present

## 2019-12-18 DIAGNOSIS — M109 Gout, unspecified: Secondary | ICD-10-CM | POA: Diagnosis not present

## 2019-12-18 DIAGNOSIS — M1990 Unspecified osteoarthritis, unspecified site: Secondary | ICD-10-CM | POA: Diagnosis not present

## 2019-12-18 DIAGNOSIS — Z96651 Presence of right artificial knee joint: Secondary | ICD-10-CM | POA: Diagnosis not present

## 2019-12-20 DIAGNOSIS — I10 Essential (primary) hypertension: Secondary | ICD-10-CM | POA: Diagnosis not present

## 2019-12-20 DIAGNOSIS — M1990 Unspecified osteoarthritis, unspecified site: Secondary | ICD-10-CM | POA: Diagnosis not present

## 2019-12-20 DIAGNOSIS — Z471 Aftercare following joint replacement surgery: Secondary | ICD-10-CM | POA: Diagnosis not present

## 2019-12-20 DIAGNOSIS — M109 Gout, unspecified: Secondary | ICD-10-CM | POA: Diagnosis not present

## 2019-12-20 DIAGNOSIS — Z96651 Presence of right artificial knee joint: Secondary | ICD-10-CM | POA: Diagnosis not present

## 2019-12-25 DIAGNOSIS — M109 Gout, unspecified: Secondary | ICD-10-CM | POA: Diagnosis not present

## 2019-12-25 DIAGNOSIS — M1990 Unspecified osteoarthritis, unspecified site: Secondary | ICD-10-CM | POA: Diagnosis not present

## 2019-12-25 DIAGNOSIS — Z96651 Presence of right artificial knee joint: Secondary | ICD-10-CM | POA: Diagnosis not present

## 2019-12-25 DIAGNOSIS — I10 Essential (primary) hypertension: Secondary | ICD-10-CM | POA: Diagnosis not present

## 2019-12-25 DIAGNOSIS — Z471 Aftercare following joint replacement surgery: Secondary | ICD-10-CM | POA: Diagnosis not present

## 2019-12-27 DIAGNOSIS — M109 Gout, unspecified: Secondary | ICD-10-CM | POA: Diagnosis not present

## 2019-12-27 DIAGNOSIS — Z471 Aftercare following joint replacement surgery: Secondary | ICD-10-CM | POA: Diagnosis not present

## 2019-12-27 DIAGNOSIS — Z96651 Presence of right artificial knee joint: Secondary | ICD-10-CM | POA: Diagnosis not present

## 2019-12-27 DIAGNOSIS — I10 Essential (primary) hypertension: Secondary | ICD-10-CM | POA: Diagnosis not present

## 2019-12-27 DIAGNOSIS — M1990 Unspecified osteoarthritis, unspecified site: Secondary | ICD-10-CM | POA: Diagnosis not present

## 2019-12-30 DIAGNOSIS — M109 Gout, unspecified: Secondary | ICD-10-CM | POA: Diagnosis not present

## 2019-12-30 DIAGNOSIS — M1990 Unspecified osteoarthritis, unspecified site: Secondary | ICD-10-CM | POA: Diagnosis not present

## 2019-12-30 DIAGNOSIS — Z471 Aftercare following joint replacement surgery: Secondary | ICD-10-CM | POA: Diagnosis not present

## 2019-12-30 DIAGNOSIS — Z96651 Presence of right artificial knee joint: Secondary | ICD-10-CM | POA: Diagnosis not present

## 2019-12-30 DIAGNOSIS — I10 Essential (primary) hypertension: Secondary | ICD-10-CM | POA: Diagnosis not present

## 2020-01-02 DIAGNOSIS — M1990 Unspecified osteoarthritis, unspecified site: Secondary | ICD-10-CM | POA: Diagnosis not present

## 2020-01-02 DIAGNOSIS — Z471 Aftercare following joint replacement surgery: Secondary | ICD-10-CM | POA: Diagnosis not present

## 2020-01-02 DIAGNOSIS — Z96651 Presence of right artificial knee joint: Secondary | ICD-10-CM | POA: Diagnosis not present

## 2020-01-02 DIAGNOSIS — M109 Gout, unspecified: Secondary | ICD-10-CM | POA: Diagnosis not present

## 2020-01-02 DIAGNOSIS — I10 Essential (primary) hypertension: Secondary | ICD-10-CM | POA: Diagnosis not present

## 2020-01-04 DIAGNOSIS — Z96651 Presence of right artificial knee joint: Secondary | ICD-10-CM | POA: Diagnosis not present

## 2020-01-07 DIAGNOSIS — Z96651 Presence of right artificial knee joint: Secondary | ICD-10-CM | POA: Diagnosis not present

## 2020-01-07 DIAGNOSIS — Z471 Aftercare following joint replacement surgery: Secondary | ICD-10-CM | POA: Diagnosis not present

## 2020-01-08 DIAGNOSIS — Z96651 Presence of right artificial knee joint: Secondary | ICD-10-CM | POA: Diagnosis not present

## 2020-01-08 DIAGNOSIS — M25661 Stiffness of right knee, not elsewhere classified: Secondary | ICD-10-CM | POA: Diagnosis not present

## 2020-01-11 DIAGNOSIS — Z96651 Presence of right artificial knee joint: Secondary | ICD-10-CM | POA: Diagnosis not present

## 2020-01-11 DIAGNOSIS — M25661 Stiffness of right knee, not elsewhere classified: Secondary | ICD-10-CM | POA: Diagnosis not present

## 2020-01-15 DIAGNOSIS — M25661 Stiffness of right knee, not elsewhere classified: Secondary | ICD-10-CM | POA: Diagnosis not present

## 2020-01-15 DIAGNOSIS — Z96651 Presence of right artificial knee joint: Secondary | ICD-10-CM | POA: Diagnosis not present

## 2020-01-21 DIAGNOSIS — Z96651 Presence of right artificial knee joint: Secondary | ICD-10-CM | POA: Diagnosis not present

## 2020-01-21 DIAGNOSIS — M25661 Stiffness of right knee, not elsewhere classified: Secondary | ICD-10-CM | POA: Diagnosis not present

## 2020-01-23 DIAGNOSIS — M25661 Stiffness of right knee, not elsewhere classified: Secondary | ICD-10-CM | POA: Diagnosis not present

## 2020-01-23 DIAGNOSIS — Z96651 Presence of right artificial knee joint: Secondary | ICD-10-CM | POA: Diagnosis not present

## 2020-01-24 DIAGNOSIS — D3131 Benign neoplasm of right choroid: Secondary | ICD-10-CM | POA: Diagnosis not present

## 2020-01-24 DIAGNOSIS — H2513 Age-related nuclear cataract, bilateral: Secondary | ICD-10-CM | POA: Diagnosis not present

## 2020-01-29 DIAGNOSIS — M25661 Stiffness of right knee, not elsewhere classified: Secondary | ICD-10-CM | POA: Diagnosis not present

## 2020-01-29 DIAGNOSIS — Z96651 Presence of right artificial knee joint: Secondary | ICD-10-CM | POA: Diagnosis not present

## 2020-02-01 DIAGNOSIS — Z96651 Presence of right artificial knee joint: Secondary | ICD-10-CM | POA: Diagnosis not present

## 2020-02-01 DIAGNOSIS — M25661 Stiffness of right knee, not elsewhere classified: Secondary | ICD-10-CM | POA: Diagnosis not present

## 2020-02-04 DIAGNOSIS — Z96651 Presence of right artificial knee joint: Secondary | ICD-10-CM | POA: Diagnosis not present

## 2020-02-04 DIAGNOSIS — M25661 Stiffness of right knee, not elsewhere classified: Secondary | ICD-10-CM | POA: Diagnosis not present

## 2020-02-07 DIAGNOSIS — I10 Essential (primary) hypertension: Secondary | ICD-10-CM | POA: Diagnosis not present

## 2020-02-07 DIAGNOSIS — Z91018 Allergy to other foods: Secondary | ICD-10-CM | POA: Diagnosis not present

## 2020-02-07 DIAGNOSIS — Z6833 Body mass index (BMI) 33.0-33.9, adult: Secondary | ICD-10-CM | POA: Diagnosis not present

## 2020-02-08 DIAGNOSIS — M25661 Stiffness of right knee, not elsewhere classified: Secondary | ICD-10-CM | POA: Diagnosis not present

## 2020-02-08 DIAGNOSIS — Z96651 Presence of right artificial knee joint: Secondary | ICD-10-CM | POA: Diagnosis not present

## 2020-02-11 DIAGNOSIS — D1801 Hemangioma of skin and subcutaneous tissue: Secondary | ICD-10-CM | POA: Diagnosis not present

## 2020-02-11 DIAGNOSIS — L820 Inflamed seborrheic keratosis: Secondary | ICD-10-CM | POA: Diagnosis not present

## 2020-02-11 DIAGNOSIS — Z85828 Personal history of other malignant neoplasm of skin: Secondary | ICD-10-CM | POA: Diagnosis not present

## 2020-02-11 DIAGNOSIS — L570 Actinic keratosis: Secondary | ICD-10-CM | POA: Diagnosis not present

## 2020-02-11 DIAGNOSIS — L821 Other seborrheic keratosis: Secondary | ICD-10-CM | POA: Diagnosis not present

## 2020-02-11 DIAGNOSIS — D225 Melanocytic nevi of trunk: Secondary | ICD-10-CM | POA: Diagnosis not present

## 2020-02-11 DIAGNOSIS — C44319 Basal cell carcinoma of skin of other parts of face: Secondary | ICD-10-CM | POA: Diagnosis not present

## 2020-02-11 DIAGNOSIS — D485 Neoplasm of uncertain behavior of skin: Secondary | ICD-10-CM | POA: Diagnosis not present

## 2020-02-12 DIAGNOSIS — M25661 Stiffness of right knee, not elsewhere classified: Secondary | ICD-10-CM | POA: Diagnosis not present

## 2020-02-12 DIAGNOSIS — Z96651 Presence of right artificial knee joint: Secondary | ICD-10-CM | POA: Diagnosis not present

## 2020-05-06 DIAGNOSIS — L570 Actinic keratosis: Secondary | ICD-10-CM | POA: Diagnosis not present

## 2020-05-06 DIAGNOSIS — Z85828 Personal history of other malignant neoplasm of skin: Secondary | ICD-10-CM | POA: Diagnosis not present

## 2020-05-19 DIAGNOSIS — M1711 Unilateral primary osteoarthritis, right knee: Secondary | ICD-10-CM | POA: Diagnosis not present

## 2020-05-29 DIAGNOSIS — Z23 Encounter for immunization: Secondary | ICD-10-CM | POA: Diagnosis not present

## 2020-06-23 DIAGNOSIS — Z23 Encounter for immunization: Secondary | ICD-10-CM | POA: Diagnosis not present

## 2020-07-31 DIAGNOSIS — Z125 Encounter for screening for malignant neoplasm of prostate: Secondary | ICD-10-CM | POA: Diagnosis not present

## 2020-07-31 DIAGNOSIS — I10 Essential (primary) hypertension: Secondary | ICD-10-CM | POA: Diagnosis not present

## 2020-07-31 DIAGNOSIS — M109 Gout, unspecified: Secondary | ICD-10-CM | POA: Diagnosis not present

## 2020-07-31 DIAGNOSIS — Z79899 Other long term (current) drug therapy: Secondary | ICD-10-CM | POA: Diagnosis not present

## 2020-08-07 DIAGNOSIS — Z0001 Encounter for general adult medical examination with abnormal findings: Secondary | ICD-10-CM | POA: Diagnosis not present

## 2020-08-07 DIAGNOSIS — I10 Essential (primary) hypertension: Secondary | ICD-10-CM | POA: Diagnosis not present

## 2020-08-07 DIAGNOSIS — Z6834 Body mass index (BMI) 34.0-34.9, adult: Secondary | ICD-10-CM | POA: Diagnosis not present

## 2020-08-07 DIAGNOSIS — E785 Hyperlipidemia, unspecified: Secondary | ICD-10-CM | POA: Diagnosis not present

## 2020-08-07 DIAGNOSIS — Z8673 Personal history of transient ischemic attack (TIA), and cerebral infarction without residual deficits: Secondary | ICD-10-CM | POA: Diagnosis not present

## 2020-08-07 DIAGNOSIS — I491 Atrial premature depolarization: Secondary | ICD-10-CM | POA: Diagnosis not present

## 2020-08-13 DIAGNOSIS — L820 Inflamed seborrheic keratosis: Secondary | ICD-10-CM | POA: Diagnosis not present

## 2020-08-13 DIAGNOSIS — L570 Actinic keratosis: Secondary | ICD-10-CM | POA: Diagnosis not present

## 2020-08-13 DIAGNOSIS — D225 Melanocytic nevi of trunk: Secondary | ICD-10-CM | POA: Diagnosis not present

## 2020-08-13 DIAGNOSIS — Z85828 Personal history of other malignant neoplasm of skin: Secondary | ICD-10-CM | POA: Diagnosis not present

## 2020-08-13 DIAGNOSIS — D692 Other nonthrombocytopenic purpura: Secondary | ICD-10-CM | POA: Diagnosis not present

## 2020-08-13 DIAGNOSIS — L821 Other seborrheic keratosis: Secondary | ICD-10-CM | POA: Diagnosis not present

## 2020-08-13 DIAGNOSIS — D1801 Hemangioma of skin and subcutaneous tissue: Secondary | ICD-10-CM | POA: Diagnosis not present

## 2020-10-31 DIAGNOSIS — Z6835 Body mass index (BMI) 35.0-35.9, adult: Secondary | ICD-10-CM | POA: Diagnosis not present

## 2020-10-31 DIAGNOSIS — Z0000 Encounter for general adult medical examination without abnormal findings: Secondary | ICD-10-CM | POA: Diagnosis not present

## 2020-11-03 DIAGNOSIS — N401 Enlarged prostate with lower urinary tract symptoms: Secondary | ICD-10-CM | POA: Diagnosis not present

## 2020-11-05 DIAGNOSIS — L509 Urticaria, unspecified: Secondary | ICD-10-CM | POA: Diagnosis not present

## 2020-11-05 DIAGNOSIS — L272 Dermatitis due to ingested food: Secondary | ICD-10-CM | POA: Diagnosis not present

## 2020-11-10 DIAGNOSIS — N401 Enlarged prostate with lower urinary tract symptoms: Secondary | ICD-10-CM | POA: Diagnosis not present

## 2020-11-10 DIAGNOSIS — R3914 Feeling of incomplete bladder emptying: Secondary | ICD-10-CM | POA: Diagnosis not present

## 2020-11-10 DIAGNOSIS — Z125 Encounter for screening for malignant neoplasm of prostate: Secondary | ICD-10-CM | POA: Diagnosis not present

## 2020-11-24 DIAGNOSIS — Z85828 Personal history of other malignant neoplasm of skin: Secondary | ICD-10-CM | POA: Diagnosis not present

## 2020-11-24 DIAGNOSIS — L738 Other specified follicular disorders: Secondary | ICD-10-CM | POA: Diagnosis not present

## 2020-11-24 DIAGNOSIS — D485 Neoplasm of uncertain behavior of skin: Secondary | ICD-10-CM | POA: Diagnosis not present

## 2020-11-24 DIAGNOSIS — L570 Actinic keratosis: Secondary | ICD-10-CM | POA: Diagnosis not present

## 2020-11-26 DIAGNOSIS — M25562 Pain in left knee: Secondary | ICD-10-CM | POA: Diagnosis not present

## 2020-11-26 DIAGNOSIS — Z96651 Presence of right artificial knee joint: Secondary | ICD-10-CM | POA: Diagnosis not present

## 2020-11-26 DIAGNOSIS — M11262 Other chondrocalcinosis, left knee: Secondary | ICD-10-CM | POA: Diagnosis not present

## 2020-11-26 DIAGNOSIS — M25461 Effusion, right knee: Secondary | ICD-10-CM | POA: Diagnosis not present

## 2020-11-26 DIAGNOSIS — Z96653 Presence of artificial knee joint, bilateral: Secondary | ICD-10-CM | POA: Diagnosis not present

## 2020-11-26 DIAGNOSIS — M1712 Unilateral primary osteoarthritis, left knee: Secondary | ICD-10-CM | POA: Diagnosis not present

## 2020-11-26 DIAGNOSIS — M25462 Effusion, left knee: Secondary | ICD-10-CM | POA: Diagnosis not present

## 2020-11-26 DIAGNOSIS — Z471 Aftercare following joint replacement surgery: Secondary | ICD-10-CM | POA: Diagnosis not present

## 2021-01-30 DIAGNOSIS — H43813 Vitreous degeneration, bilateral: Secondary | ICD-10-CM | POA: Diagnosis not present

## 2021-01-30 DIAGNOSIS — H2513 Age-related nuclear cataract, bilateral: Secondary | ICD-10-CM | POA: Diagnosis not present

## 2021-01-30 DIAGNOSIS — D3131 Benign neoplasm of right choroid: Secondary | ICD-10-CM | POA: Diagnosis not present

## 2021-02-05 DIAGNOSIS — N401 Enlarged prostate with lower urinary tract symptoms: Secondary | ICD-10-CM | POA: Diagnosis not present

## 2021-02-05 DIAGNOSIS — Z8673 Personal history of transient ischemic attack (TIA), and cerebral infarction without residual deficits: Secondary | ICD-10-CM | POA: Diagnosis not present

## 2021-02-05 DIAGNOSIS — I10 Essential (primary) hypertension: Secondary | ICD-10-CM | POA: Diagnosis not present

## 2021-02-12 DIAGNOSIS — L218 Other seborrheic dermatitis: Secondary | ICD-10-CM | POA: Diagnosis not present

## 2021-02-12 DIAGNOSIS — D485 Neoplasm of uncertain behavior of skin: Secondary | ICD-10-CM | POA: Diagnosis not present

## 2021-02-12 DIAGNOSIS — C44329 Squamous cell carcinoma of skin of other parts of face: Secondary | ICD-10-CM | POA: Diagnosis not present

## 2021-02-12 DIAGNOSIS — Z85828 Personal history of other malignant neoplasm of skin: Secondary | ICD-10-CM | POA: Diagnosis not present

## 2021-02-12 DIAGNOSIS — L821 Other seborrheic keratosis: Secondary | ICD-10-CM | POA: Diagnosis not present

## 2021-02-12 DIAGNOSIS — D1801 Hemangioma of skin and subcutaneous tissue: Secondary | ICD-10-CM | POA: Diagnosis not present

## 2021-02-12 DIAGNOSIS — D225 Melanocytic nevi of trunk: Secondary | ICD-10-CM | POA: Diagnosis not present

## 2021-02-12 DIAGNOSIS — L570 Actinic keratosis: Secondary | ICD-10-CM | POA: Diagnosis not present

## 2021-06-03 DIAGNOSIS — Z23 Encounter for immunization: Secondary | ICD-10-CM | POA: Diagnosis not present

## 2021-06-12 DIAGNOSIS — M545 Low back pain, unspecified: Secondary | ICD-10-CM | POA: Diagnosis not present

## 2021-07-10 DIAGNOSIS — M545 Low back pain, unspecified: Secondary | ICD-10-CM | POA: Diagnosis not present

## 2021-07-14 DIAGNOSIS — M545 Low back pain, unspecified: Secondary | ICD-10-CM | POA: Diagnosis not present

## 2021-07-20 DIAGNOSIS — M545 Low back pain, unspecified: Secondary | ICD-10-CM | POA: Diagnosis not present

## 2021-07-27 DIAGNOSIS — M545 Low back pain, unspecified: Secondary | ICD-10-CM | POA: Diagnosis not present

## 2021-08-03 DIAGNOSIS — Z125 Encounter for screening for malignant neoplasm of prostate: Secondary | ICD-10-CM | POA: Diagnosis not present

## 2021-08-03 DIAGNOSIS — I10 Essential (primary) hypertension: Secondary | ICD-10-CM | POA: Diagnosis not present

## 2021-08-03 DIAGNOSIS — E7429 Other disorders of galactose metabolism: Secondary | ICD-10-CM | POA: Diagnosis not present

## 2021-08-03 DIAGNOSIS — Z8673 Personal history of transient ischemic attack (TIA), and cerebral infarction without residual deficits: Secondary | ICD-10-CM | POA: Diagnosis not present

## 2021-08-03 DIAGNOSIS — Z1159 Encounter for screening for other viral diseases: Secondary | ICD-10-CM | POA: Diagnosis not present

## 2021-08-03 DIAGNOSIS — Z79899 Other long term (current) drug therapy: Secondary | ICD-10-CM | POA: Diagnosis not present

## 2021-08-04 DIAGNOSIS — M545 Low back pain, unspecified: Secondary | ICD-10-CM | POA: Diagnosis not present

## 2021-08-10 DIAGNOSIS — I491 Atrial premature depolarization: Secondary | ICD-10-CM | POA: Diagnosis not present

## 2021-08-10 DIAGNOSIS — E785 Hyperlipidemia, unspecified: Secondary | ICD-10-CM | POA: Diagnosis not present

## 2021-08-10 DIAGNOSIS — I10 Essential (primary) hypertension: Secondary | ICD-10-CM | POA: Diagnosis not present

## 2021-08-10 DIAGNOSIS — Z6834 Body mass index (BMI) 34.0-34.9, adult: Secondary | ICD-10-CM | POA: Diagnosis not present

## 2021-08-10 DIAGNOSIS — N401 Enlarged prostate with lower urinary tract symptoms: Secondary | ICD-10-CM | POA: Diagnosis not present

## 2021-08-10 DIAGNOSIS — Z8673 Personal history of transient ischemic attack (TIA), and cerebral infarction without residual deficits: Secondary | ICD-10-CM | POA: Diagnosis not present

## 2021-08-14 DIAGNOSIS — M545 Low back pain, unspecified: Secondary | ICD-10-CM | POA: Diagnosis not present

## 2021-08-19 DIAGNOSIS — D225 Melanocytic nevi of trunk: Secondary | ICD-10-CM | POA: Diagnosis not present

## 2021-08-19 DIAGNOSIS — L821 Other seborrheic keratosis: Secondary | ICD-10-CM | POA: Diagnosis not present

## 2021-08-19 DIAGNOSIS — L570 Actinic keratosis: Secondary | ICD-10-CM | POA: Diagnosis not present

## 2021-08-19 DIAGNOSIS — D485 Neoplasm of uncertain behavior of skin: Secondary | ICD-10-CM | POA: Diagnosis not present

## 2021-08-19 DIAGNOSIS — Z85828 Personal history of other malignant neoplasm of skin: Secondary | ICD-10-CM | POA: Diagnosis not present

## 2021-08-19 DIAGNOSIS — D0439 Carcinoma in situ of skin of other parts of face: Secondary | ICD-10-CM | POA: Diagnosis not present

## 2021-08-19 DIAGNOSIS — L309 Dermatitis, unspecified: Secondary | ICD-10-CM | POA: Diagnosis not present

## 2021-08-19 DIAGNOSIS — L820 Inflamed seborrheic keratosis: Secondary | ICD-10-CM | POA: Diagnosis not present

## 2021-08-19 DIAGNOSIS — D692 Other nonthrombocytopenic purpura: Secondary | ICD-10-CM | POA: Diagnosis not present

## 2021-08-27 DIAGNOSIS — M545 Low back pain, unspecified: Secondary | ICD-10-CM | POA: Diagnosis not present

## 2021-09-09 DIAGNOSIS — M25562 Pain in left knee: Secondary | ICD-10-CM | POA: Diagnosis not present

## 2021-09-09 DIAGNOSIS — M1712 Unilateral primary osteoarthritis, left knee: Secondary | ICD-10-CM | POA: Diagnosis not present

## 2021-09-09 DIAGNOSIS — Z96651 Presence of right artificial knee joint: Secondary | ICD-10-CM | POA: Diagnosis not present

## 2021-09-23 DIAGNOSIS — M1712 Unilateral primary osteoarthritis, left knee: Secondary | ICD-10-CM | POA: Diagnosis not present

## 2021-10-27 DIAGNOSIS — Z125 Encounter for screening for malignant neoplasm of prostate: Secondary | ICD-10-CM | POA: Diagnosis not present

## 2021-10-27 DIAGNOSIS — I10 Essential (primary) hypertension: Secondary | ICD-10-CM | POA: Diagnosis not present

## 2021-10-27 DIAGNOSIS — Z0001 Encounter for general adult medical examination with abnormal findings: Secondary | ICD-10-CM | POA: Diagnosis not present

## 2021-10-27 DIAGNOSIS — N200 Calculus of kidney: Secondary | ICD-10-CM | POA: Diagnosis not present

## 2021-11-03 DIAGNOSIS — R3914 Feeling of incomplete bladder emptying: Secondary | ICD-10-CM | POA: Diagnosis not present

## 2021-11-03 DIAGNOSIS — N401 Enlarged prostate with lower urinary tract symptoms: Secondary | ICD-10-CM | POA: Diagnosis not present

## 2022-02-08 DIAGNOSIS — N400 Enlarged prostate without lower urinary tract symptoms: Secondary | ICD-10-CM | POA: Diagnosis not present

## 2022-02-08 DIAGNOSIS — I10 Essential (primary) hypertension: Secondary | ICD-10-CM | POA: Diagnosis not present

## 2022-02-18 DIAGNOSIS — D485 Neoplasm of uncertain behavior of skin: Secondary | ICD-10-CM | POA: Diagnosis not present

## 2022-02-18 DIAGNOSIS — C44311 Basal cell carcinoma of skin of nose: Secondary | ICD-10-CM | POA: Diagnosis not present

## 2022-02-18 DIAGNOSIS — L821 Other seborrheic keratosis: Secondary | ICD-10-CM | POA: Diagnosis not present

## 2022-02-18 DIAGNOSIS — D225 Melanocytic nevi of trunk: Secondary | ICD-10-CM | POA: Diagnosis not present

## 2022-02-18 DIAGNOSIS — Z85828 Personal history of other malignant neoplasm of skin: Secondary | ICD-10-CM | POA: Diagnosis not present

## 2022-02-18 DIAGNOSIS — L814 Other melanin hyperpigmentation: Secondary | ICD-10-CM | POA: Diagnosis not present

## 2022-02-18 DIAGNOSIS — L570 Actinic keratosis: Secondary | ICD-10-CM | POA: Diagnosis not present

## 2022-04-05 DIAGNOSIS — C44311 Basal cell carcinoma of skin of nose: Secondary | ICD-10-CM | POA: Diagnosis not present

## 2022-04-05 DIAGNOSIS — Z85828 Personal history of other malignant neoplasm of skin: Secondary | ICD-10-CM | POA: Diagnosis not present

## 2022-05-04 DIAGNOSIS — R3914 Feeling of incomplete bladder emptying: Secondary | ICD-10-CM | POA: Diagnosis not present

## 2022-05-04 DIAGNOSIS — Z125 Encounter for screening for malignant neoplasm of prostate: Secondary | ICD-10-CM | POA: Diagnosis not present

## 2022-05-04 DIAGNOSIS — N401 Enlarged prostate with lower urinary tract symptoms: Secondary | ICD-10-CM | POA: Diagnosis not present

## 2022-05-24 DIAGNOSIS — R3914 Feeling of incomplete bladder emptying: Secondary | ICD-10-CM | POA: Diagnosis not present

## 2022-06-02 DIAGNOSIS — N401 Enlarged prostate with lower urinary tract symptoms: Secondary | ICD-10-CM | POA: Diagnosis not present

## 2022-06-02 DIAGNOSIS — R3914 Feeling of incomplete bladder emptying: Secondary | ICD-10-CM | POA: Diagnosis not present

## 2022-06-18 DIAGNOSIS — Z23 Encounter for immunization: Secondary | ICD-10-CM | POA: Diagnosis not present

## 2022-07-12 DIAGNOSIS — M5030 Other cervical disc degeneration, unspecified cervical region: Secondary | ICD-10-CM | POA: Diagnosis not present

## 2022-07-12 DIAGNOSIS — M542 Cervicalgia: Secondary | ICD-10-CM | POA: Diagnosis not present

## 2022-07-12 DIAGNOSIS — M5032 Other cervical disc degeneration, mid-cervical region, unspecified level: Secondary | ICD-10-CM | POA: Diagnosis not present

## 2022-07-14 DIAGNOSIS — M542 Cervicalgia: Secondary | ICD-10-CM | POA: Diagnosis not present

## 2022-07-17 DIAGNOSIS — R040 Epistaxis: Secondary | ICD-10-CM | POA: Diagnosis not present

## 2022-07-29 DIAGNOSIS — M542 Cervicalgia: Secondary | ICD-10-CM | POA: Diagnosis not present

## 2022-07-29 DIAGNOSIS — H2513 Age-related nuclear cataract, bilateral: Secondary | ICD-10-CM | POA: Diagnosis not present

## 2022-07-31 DIAGNOSIS — J0111 Acute recurrent frontal sinusitis: Secondary | ICD-10-CM | POA: Diagnosis not present

## 2022-07-31 DIAGNOSIS — J3489 Other specified disorders of nose and nasal sinuses: Secondary | ICD-10-CM | POA: Diagnosis not present

## 2022-08-04 DIAGNOSIS — M542 Cervicalgia: Secondary | ICD-10-CM | POA: Diagnosis not present

## 2022-08-05 DIAGNOSIS — N400 Enlarged prostate without lower urinary tract symptoms: Secondary | ICD-10-CM | POA: Diagnosis not present

## 2022-08-05 DIAGNOSIS — Z79899 Other long term (current) drug therapy: Secondary | ICD-10-CM | POA: Diagnosis not present

## 2022-08-05 DIAGNOSIS — Z8673 Personal history of transient ischemic attack (TIA), and cerebral infarction without residual deficits: Secondary | ICD-10-CM | POA: Diagnosis not present

## 2022-08-05 DIAGNOSIS — I10 Essential (primary) hypertension: Secondary | ICD-10-CM | POA: Diagnosis not present

## 2022-08-05 DIAGNOSIS — Z91014 Allergy to mammalian meats: Secondary | ICD-10-CM | POA: Diagnosis not present

## 2022-08-05 DIAGNOSIS — M1000 Idiopathic gout, unspecified site: Secondary | ICD-10-CM | POA: Diagnosis not present

## 2022-08-05 DIAGNOSIS — Z125 Encounter for screening for malignant neoplasm of prostate: Secondary | ICD-10-CM | POA: Diagnosis not present

## 2022-08-05 DIAGNOSIS — E785 Hyperlipidemia, unspecified: Secondary | ICD-10-CM | POA: Diagnosis not present

## 2022-08-05 DIAGNOSIS — M1990 Unspecified osteoarthritis, unspecified site: Secondary | ICD-10-CM | POA: Diagnosis not present

## 2022-08-10 DIAGNOSIS — M542 Cervicalgia: Secondary | ICD-10-CM | POA: Diagnosis not present

## 2022-08-12 DIAGNOSIS — E785 Hyperlipidemia, unspecified: Secondary | ICD-10-CM | POA: Diagnosis not present

## 2022-08-12 DIAGNOSIS — N400 Enlarged prostate without lower urinary tract symptoms: Secondary | ICD-10-CM | POA: Diagnosis not present

## 2022-08-12 DIAGNOSIS — I491 Atrial premature depolarization: Secondary | ICD-10-CM | POA: Diagnosis not present

## 2022-08-12 DIAGNOSIS — Z8673 Personal history of transient ischemic attack (TIA), and cerebral infarction without residual deficits: Secondary | ICD-10-CM | POA: Diagnosis not present

## 2022-08-12 DIAGNOSIS — Z0000 Encounter for general adult medical examination without abnormal findings: Secondary | ICD-10-CM | POA: Diagnosis not present

## 2022-08-12 DIAGNOSIS — I10 Essential (primary) hypertension: Secondary | ICD-10-CM | POA: Diagnosis not present

## 2022-08-17 DIAGNOSIS — M542 Cervicalgia: Secondary | ICD-10-CM | POA: Diagnosis not present

## 2022-08-19 DIAGNOSIS — D1801 Hemangioma of skin and subcutaneous tissue: Secondary | ICD-10-CM | POA: Diagnosis not present

## 2022-08-19 DIAGNOSIS — D692 Other nonthrombocytopenic purpura: Secondary | ICD-10-CM | POA: Diagnosis not present

## 2022-08-19 DIAGNOSIS — Z85828 Personal history of other malignant neoplasm of skin: Secondary | ICD-10-CM | POA: Diagnosis not present

## 2022-08-19 DIAGNOSIS — L905 Scar conditions and fibrosis of skin: Secondary | ICD-10-CM | POA: Diagnosis not present

## 2022-08-19 DIAGNOSIS — L570 Actinic keratosis: Secondary | ICD-10-CM | POA: Diagnosis not present

## 2022-08-19 DIAGNOSIS — L821 Other seborrheic keratosis: Secondary | ICD-10-CM | POA: Diagnosis not present

## 2022-09-06 DIAGNOSIS — L272 Dermatitis due to ingested food: Secondary | ICD-10-CM | POA: Diagnosis not present

## 2022-09-15 DIAGNOSIS — M109 Gout, unspecified: Secondary | ICD-10-CM | POA: Diagnosis not present

## 2022-09-15 DIAGNOSIS — N400 Enlarged prostate without lower urinary tract symptoms: Secondary | ICD-10-CM | POA: Diagnosis not present

## 2022-09-15 DIAGNOSIS — I10 Essential (primary) hypertension: Secondary | ICD-10-CM | POA: Diagnosis not present

## 2022-09-15 DIAGNOSIS — Z7982 Long term (current) use of aspirin: Secondary | ICD-10-CM | POA: Diagnosis not present

## 2022-09-15 DIAGNOSIS — R339 Retention of urine, unspecified: Secondary | ICD-10-CM | POA: Diagnosis not present

## 2022-09-15 DIAGNOSIS — R351 Nocturia: Secondary | ICD-10-CM | POA: Diagnosis not present

## 2022-09-15 DIAGNOSIS — Z79899 Other long term (current) drug therapy: Secondary | ICD-10-CM | POA: Diagnosis not present

## 2022-09-15 DIAGNOSIS — Z86718 Personal history of other venous thrombosis and embolism: Secondary | ICD-10-CM | POA: Diagnosis not present

## 2022-09-15 DIAGNOSIS — N401 Enlarged prostate with lower urinary tract symptoms: Secondary | ICD-10-CM | POA: Diagnosis not present

## 2022-09-15 DIAGNOSIS — Z880 Allergy status to penicillin: Secondary | ICD-10-CM | POA: Diagnosis not present

## 2022-09-15 DIAGNOSIS — R350 Frequency of micturition: Secondary | ICD-10-CM | POA: Diagnosis not present

## 2022-09-15 DIAGNOSIS — N200 Calculus of kidney: Secondary | ICD-10-CM | POA: Diagnosis not present

## 2022-09-15 DIAGNOSIS — R3914 Feeling of incomplete bladder emptying: Secondary | ICD-10-CM | POA: Diagnosis not present

## 2022-09-15 DIAGNOSIS — Z882 Allergy status to sulfonamides status: Secondary | ICD-10-CM | POA: Diagnosis not present

## 2022-09-22 DIAGNOSIS — M542 Cervicalgia: Secondary | ICD-10-CM | POA: Diagnosis not present

## 2022-11-03 DIAGNOSIS — C4401 Basal cell carcinoma of skin of lip: Secondary | ICD-10-CM | POA: Diagnosis not present

## 2022-11-03 DIAGNOSIS — D1801 Hemangioma of skin and subcutaneous tissue: Secondary | ICD-10-CM | POA: Diagnosis not present

## 2022-11-03 DIAGNOSIS — Z85828 Personal history of other malignant neoplasm of skin: Secondary | ICD-10-CM | POA: Diagnosis not present

## 2022-11-03 DIAGNOSIS — L570 Actinic keratosis: Secondary | ICD-10-CM | POA: Diagnosis not present

## 2022-11-03 DIAGNOSIS — L821 Other seborrheic keratosis: Secondary | ICD-10-CM | POA: Diagnosis not present

## 2022-11-03 DIAGNOSIS — D485 Neoplasm of uncertain behavior of skin: Secondary | ICD-10-CM | POA: Diagnosis not present

## 2022-12-17 DIAGNOSIS — E785 Hyperlipidemia, unspecified: Secondary | ICD-10-CM | POA: Diagnosis not present

## 2022-12-17 DIAGNOSIS — Z7982 Long term (current) use of aspirin: Secondary | ICD-10-CM | POA: Diagnosis not present

## 2022-12-17 DIAGNOSIS — N323 Diverticulum of bladder: Secondary | ICD-10-CM | POA: Diagnosis not present

## 2022-12-17 DIAGNOSIS — N400 Enlarged prostate without lower urinary tract symptoms: Secondary | ICD-10-CM | POA: Diagnosis not present

## 2022-12-17 DIAGNOSIS — R339 Retention of urine, unspecified: Secondary | ICD-10-CM | POA: Diagnosis not present

## 2022-12-17 DIAGNOSIS — Z882 Allergy status to sulfonamides status: Secondary | ICD-10-CM | POA: Diagnosis not present

## 2022-12-17 DIAGNOSIS — I10 Essential (primary) hypertension: Secondary | ICD-10-CM | POA: Diagnosis not present

## 2022-12-17 DIAGNOSIS — R338 Other retention of urine: Secondary | ICD-10-CM | POA: Diagnosis not present

## 2022-12-17 DIAGNOSIS — M109 Gout, unspecified: Secondary | ICD-10-CM | POA: Diagnosis not present

## 2022-12-17 DIAGNOSIS — N200 Calculus of kidney: Secondary | ICD-10-CM | POA: Diagnosis not present

## 2022-12-17 DIAGNOSIS — Z880 Allergy status to penicillin: Secondary | ICD-10-CM | POA: Diagnosis not present

## 2022-12-17 DIAGNOSIS — R350 Frequency of micturition: Secondary | ICD-10-CM | POA: Diagnosis not present

## 2022-12-17 DIAGNOSIS — N401 Enlarged prostate with lower urinary tract symptoms: Secondary | ICD-10-CM | POA: Diagnosis not present

## 2022-12-20 DIAGNOSIS — C4401 Basal cell carcinoma of skin of lip: Secondary | ICD-10-CM | POA: Diagnosis not present

## 2022-12-20 DIAGNOSIS — Z85828 Personal history of other malignant neoplasm of skin: Secondary | ICD-10-CM | POA: Diagnosis not present

## 2023-02-08 DIAGNOSIS — L905 Scar conditions and fibrosis of skin: Secondary | ICD-10-CM | POA: Diagnosis not present

## 2023-02-08 DIAGNOSIS — Z85828 Personal history of other malignant neoplasm of skin: Secondary | ICD-10-CM | POA: Diagnosis not present

## 2023-02-08 DIAGNOSIS — L821 Other seborrheic keratosis: Secondary | ICD-10-CM | POA: Diagnosis not present

## 2023-02-08 DIAGNOSIS — D2262 Melanocytic nevi of left upper limb, including shoulder: Secondary | ICD-10-CM | POA: Diagnosis not present

## 2023-02-08 DIAGNOSIS — L570 Actinic keratosis: Secondary | ICD-10-CM | POA: Diagnosis not present

## 2023-02-08 DIAGNOSIS — D1801 Hemangioma of skin and subcutaneous tissue: Secondary | ICD-10-CM | POA: Diagnosis not present

## 2023-02-08 DIAGNOSIS — D225 Melanocytic nevi of trunk: Secondary | ICD-10-CM | POA: Diagnosis not present

## 2023-02-08 DIAGNOSIS — D2261 Melanocytic nevi of right upper limb, including shoulder: Secondary | ICD-10-CM | POA: Diagnosis not present

## 2023-02-08 DIAGNOSIS — L820 Inflamed seborrheic keratosis: Secondary | ICD-10-CM | POA: Diagnosis not present

## 2023-02-11 DIAGNOSIS — Z8673 Personal history of transient ischemic attack (TIA), and cerebral infarction without residual deficits: Secondary | ICD-10-CM | POA: Diagnosis not present

## 2023-02-11 DIAGNOSIS — I10 Essential (primary) hypertension: Secondary | ICD-10-CM | POA: Diagnosis not present

## 2023-04-08 DIAGNOSIS — E785 Hyperlipidemia, unspecified: Secondary | ICD-10-CM | POA: Diagnosis not present

## 2023-04-08 DIAGNOSIS — I10 Essential (primary) hypertension: Secondary | ICD-10-CM | POA: Diagnosis not present

## 2023-04-08 DIAGNOSIS — Z7982 Long term (current) use of aspirin: Secondary | ICD-10-CM | POA: Diagnosis not present

## 2023-04-08 DIAGNOSIS — N401 Enlarged prostate with lower urinary tract symptoms: Secondary | ICD-10-CM | POA: Diagnosis not present

## 2023-04-08 DIAGNOSIS — Z882 Allergy status to sulfonamides status: Secondary | ICD-10-CM | POA: Diagnosis not present

## 2023-04-08 DIAGNOSIS — R3914 Feeling of incomplete bladder emptying: Secondary | ICD-10-CM | POA: Diagnosis not present

## 2023-04-08 DIAGNOSIS — N323 Diverticulum of bladder: Secondary | ICD-10-CM | POA: Diagnosis not present

## 2023-04-08 DIAGNOSIS — Z880 Allergy status to penicillin: Secondary | ICD-10-CM | POA: Diagnosis not present

## 2023-06-06 DIAGNOSIS — Z79899 Other long term (current) drug therapy: Secondary | ICD-10-CM | POA: Diagnosis not present

## 2023-06-06 DIAGNOSIS — N323 Diverticulum of bladder: Secondary | ICD-10-CM | POA: Diagnosis not present

## 2023-06-06 DIAGNOSIS — N401 Enlarged prostate with lower urinary tract symptoms: Secondary | ICD-10-CM | POA: Diagnosis not present

## 2023-06-06 DIAGNOSIS — R339 Retention of urine, unspecified: Secondary | ICD-10-CM | POA: Diagnosis not present

## 2023-06-06 DIAGNOSIS — N138 Other obstructive and reflux uropathy: Secondary | ICD-10-CM | POA: Diagnosis not present

## 2023-06-17 DIAGNOSIS — Z23 Encounter for immunization: Secondary | ICD-10-CM | POA: Diagnosis not present

## 2023-08-08 DIAGNOSIS — D3131 Benign neoplasm of right choroid: Secondary | ICD-10-CM | POA: Diagnosis not present

## 2023-08-08 DIAGNOSIS — H2513 Age-related nuclear cataract, bilateral: Secondary | ICD-10-CM | POA: Diagnosis not present

## 2023-08-10 DIAGNOSIS — Z85828 Personal history of other malignant neoplasm of skin: Secondary | ICD-10-CM | POA: Diagnosis not present

## 2023-08-10 DIAGNOSIS — D485 Neoplasm of uncertain behavior of skin: Secondary | ICD-10-CM | POA: Diagnosis not present

## 2023-08-10 DIAGNOSIS — L814 Other melanin hyperpigmentation: Secondary | ICD-10-CM | POA: Diagnosis not present

## 2023-08-10 DIAGNOSIS — L570 Actinic keratosis: Secondary | ICD-10-CM | POA: Diagnosis not present

## 2023-08-10 DIAGNOSIS — L821 Other seborrheic keratosis: Secondary | ICD-10-CM | POA: Diagnosis not present

## 2023-08-10 DIAGNOSIS — D225 Melanocytic nevi of trunk: Secondary | ICD-10-CM | POA: Diagnosis not present

## 2023-08-10 DIAGNOSIS — D0462 Carcinoma in situ of skin of left upper limb, including shoulder: Secondary | ICD-10-CM | POA: Diagnosis not present

## 2023-08-10 DIAGNOSIS — C44619 Basal cell carcinoma of skin of left upper limb, including shoulder: Secondary | ICD-10-CM | POA: Diagnosis not present

## 2023-08-11 DIAGNOSIS — M1990 Unspecified osteoarthritis, unspecified site: Secondary | ICD-10-CM | POA: Diagnosis not present

## 2023-08-11 DIAGNOSIS — Z79899 Other long term (current) drug therapy: Secondary | ICD-10-CM | POA: Diagnosis not present

## 2023-08-11 DIAGNOSIS — Z125 Encounter for screening for malignant neoplasm of prostate: Secondary | ICD-10-CM | POA: Diagnosis not present

## 2023-08-11 DIAGNOSIS — Z8673 Personal history of transient ischemic attack (TIA), and cerebral infarction without residual deficits: Secondary | ICD-10-CM | POA: Diagnosis not present

## 2023-08-11 DIAGNOSIS — N400 Enlarged prostate without lower urinary tract symptoms: Secondary | ICD-10-CM | POA: Diagnosis not present

## 2023-08-11 DIAGNOSIS — Z1159 Encounter for screening for other viral diseases: Secondary | ICD-10-CM | POA: Diagnosis not present

## 2023-08-11 DIAGNOSIS — R7301 Impaired fasting glucose: Secondary | ICD-10-CM | POA: Diagnosis not present

## 2023-08-11 DIAGNOSIS — I10 Essential (primary) hypertension: Secondary | ICD-10-CM | POA: Diagnosis not present

## 2023-08-11 DIAGNOSIS — E785 Hyperlipidemia, unspecified: Secondary | ICD-10-CM | POA: Diagnosis not present

## 2023-08-11 DIAGNOSIS — N200 Calculus of kidney: Secondary | ICD-10-CM | POA: Diagnosis not present

## 2023-08-11 DIAGNOSIS — Z91014 Allergy to mammalian meats: Secondary | ICD-10-CM | POA: Diagnosis not present

## 2023-08-11 DIAGNOSIS — M109 Gout, unspecified: Secondary | ICD-10-CM | POA: Diagnosis not present

## 2023-08-18 DIAGNOSIS — E785 Hyperlipidemia, unspecified: Secondary | ICD-10-CM | POA: Diagnosis not present

## 2023-08-18 DIAGNOSIS — Z0000 Encounter for general adult medical examination without abnormal findings: Secondary | ICD-10-CM | POA: Diagnosis not present

## 2023-08-18 DIAGNOSIS — Z8673 Personal history of transient ischemic attack (TIA), and cerebral infarction without residual deficits: Secondary | ICD-10-CM | POA: Diagnosis not present

## 2023-08-18 DIAGNOSIS — I10 Essential (primary) hypertension: Secondary | ICD-10-CM | POA: Diagnosis not present

## 2023-09-02 ENCOUNTER — Encounter (INDEPENDENT_AMBULATORY_CARE_PROVIDER_SITE_OTHER): Payer: Self-pay | Admitting: *Deleted

## 2023-09-28 DIAGNOSIS — Z85828 Personal history of other malignant neoplasm of skin: Secondary | ICD-10-CM | POA: Diagnosis not present

## 2023-09-28 DIAGNOSIS — D485 Neoplasm of uncertain behavior of skin: Secondary | ICD-10-CM | POA: Diagnosis not present

## 2023-09-28 DIAGNOSIS — B009 Herpesviral infection, unspecified: Secondary | ICD-10-CM | POA: Diagnosis not present

## 2023-09-28 DIAGNOSIS — C44329 Squamous cell carcinoma of skin of other parts of face: Secondary | ICD-10-CM | POA: Diagnosis not present

## 2023-10-06 DIAGNOSIS — N138 Other obstructive and reflux uropathy: Secondary | ICD-10-CM | POA: Diagnosis not present

## 2023-10-06 DIAGNOSIS — N401 Enlarged prostate with lower urinary tract symptoms: Secondary | ICD-10-CM | POA: Diagnosis not present

## 2023-10-12 DIAGNOSIS — R3914 Feeling of incomplete bladder emptying: Secondary | ICD-10-CM | POA: Diagnosis not present

## 2023-10-12 DIAGNOSIS — Z792 Long term (current) use of antibiotics: Secondary | ICD-10-CM | POA: Diagnosis not present

## 2023-10-12 DIAGNOSIS — Z7982 Long term (current) use of aspirin: Secondary | ICD-10-CM | POA: Diagnosis not present

## 2023-10-12 DIAGNOSIS — I10 Essential (primary) hypertension: Secondary | ICD-10-CM | POA: Diagnosis not present

## 2023-10-12 DIAGNOSIS — N323 Diverticulum of bladder: Secondary | ICD-10-CM | POA: Diagnosis not present

## 2023-10-12 DIAGNOSIS — Z79899 Other long term (current) drug therapy: Secondary | ICD-10-CM | POA: Diagnosis not present

## 2023-10-12 DIAGNOSIS — R339 Retention of urine, unspecified: Secondary | ICD-10-CM | POA: Diagnosis not present

## 2023-10-12 DIAGNOSIS — E785 Hyperlipidemia, unspecified: Secondary | ICD-10-CM | POA: Diagnosis not present

## 2023-10-12 DIAGNOSIS — M109 Gout, unspecified: Secondary | ICD-10-CM | POA: Diagnosis not present

## 2023-10-12 DIAGNOSIS — N401 Enlarged prostate with lower urinary tract symptoms: Secondary | ICD-10-CM | POA: Diagnosis not present

## 2023-11-01 ENCOUNTER — Ambulatory Visit (INDEPENDENT_AMBULATORY_CARE_PROVIDER_SITE_OTHER): Payer: Medicare Other | Admitting: Gastroenterology

## 2023-11-01 VITALS — BP 157/80 | HR 72 | Temp 97.2°F | Ht 70.0 in | Wt 225.0 lb

## 2023-11-01 DIAGNOSIS — I10 Essential (primary) hypertension: Secondary | ICD-10-CM | POA: Insufficient documentation

## 2023-11-01 DIAGNOSIS — Z09 Encounter for follow-up examination after completed treatment for conditions other than malignant neoplasm: Secondary | ICD-10-CM | POA: Diagnosis not present

## 2023-11-01 DIAGNOSIS — Z860100 Personal history of colon polyps, unspecified: Secondary | ICD-10-CM | POA: Diagnosis not present

## 2023-11-01 DIAGNOSIS — Z1211 Encounter for screening for malignant neoplasm of colon: Secondary | ICD-10-CM | POA: Insufficient documentation

## 2023-11-01 MED ORDER — PEG 3350-KCL-NA BICARB-NACL 420 G PO SOLR: 4000.0000 mL | Freq: Once | ORAL | 0 refills | Status: AC

## 2023-11-01 NOTE — Progress Notes (Signed)
 [name] , M.D. Gastroenterology & Hepatology [facility] Gastroenterology [address] [place], [place] [address] Primary Care Physician: Carylon Perches, MD [address] [place] [address]  Chief Complaint:  Surveillance colonoscopy , history of colon polyps   History of Present Illness: Rodney Wilson is a 83 y.o. male with history of colonic tubular adenomas on past 2 colonoscopies  (total of 9 TA's ) who presents for evaluation of surveillance colonoscopy prior to scheduling the procedure .  Last colonoscopy 2020 with Dr. Karilyn Cota The patient denies having any nausea, vomiting, fever, chills, hematochezia, melena, hematemesis, abdominal distention, abdominal pain, diarrhea, jaundice, pruritus or weight loss.  Last QMV:HQIO Last Colonoscopy:2020  - Three small polyps in the sigmoid colon and in the ascending colon, removed with a cold snare. Resected and retrieved. - One small polyp in the proximal ascending colon. Biopsied. - Diverticulosis in the sigmoid colon. - External hemorrhoids.  4 small polyps removed and they are all tubular adenomas.  He had 5 small tubular adenomas removed on his prior colonoscopy 5 years ago.   FHx: neg for any gastrointestinal/liver disease, no malignancies Social: neg smoking, alcohol or illicit drug use Surgical: Appendectomy, umbilical hernia repair  Last labs from December 2024 hemoglobin 16.6 platelet 218 normal liver enzymes  Past Medical History: Past Medical History:  Diagnosis Date   History of kidney stones    Hypertension     Past Surgical History: Past Surgical History:  Procedure Laterality Date   APPENDECTOMY     COLONOSCOPY N/A 07/12/2013   Procedure: COLONOSCOPY;  Surgeon: Malissa Hippo, MD;  [facility];  Service: Endoscopy;  Laterality: N/A;  1030-rescheduled to 8:30am   COLONOSCOPY N/A 09/27/2018   Procedure: COLONOSCOPY;  Surgeon: Malissa Hippo, MD;   [facility];  Service: Endoscopy;  Laterality: N/A;  1030   Kidney stone removal     POLYPECTOMY  09/27/2018   Procedure: POLYPECTOMY;  Surgeon: Malissa Hippo, MD;  [facility];  Service: Endoscopy;;  ascending colon(CSx2,CBx1), sigmoid colon(CSx1)   UMBILICAL HERNIA REPAIR      Family History: Family History  Problem Relation Age of Onset   Ovarian cancer Mother    Heart attack Father    Colon cancer Neg Hx     Social History: Social History   Tobacco Use  Smoking Status Never  Smokeless Tobacco Never   Social History   Substance and Sexual Activity  Alcohol Use No   Social History   Substance and Sexual Activity  Drug Use No    Allergies: Allergies  Allergen Reactions   Other Swelling and Rash    Alpha Gal   Penicillins Swelling and Rash    Did it involve swelling of the face/tongue/throat, SOB, or low BP? No Did it involve sudden or severe rash/hives, skin peeling, or any reaction on the inside of your mouth or nose? No Did you need to seek medical attention at a hospital or doctor's office? No When did it last happen? 20-30 years ago   If all above answers are "NO", may proceed with cephalosporin use.    Prednisone Rash   Sulfa Antibiotics Swelling and Rash    Medications: Current Outpatient Medications  Medication Sig Dispense Refill   amLODipine (NORVASC) 10 MG tablet Take 10 mg by mouth daily.     aspirin EC 81 MG tablet Take 1 tablet (81 mg total) by mouth daily.     atorvastatin (  LIPITOR) 20 MG tablet Take 20 mg by mouth daily.     tamsulosin (FLOMAX) 0.4 MG CAPS capsule Take 0.8 mg by mouth daily.     No current facility-administered medications for this visit.    Review of Systems: GENERAL: negative for malaise, night sweats HEENT: No changes in hearing or vision, no nose bleeds or other nasal problems. NECK: Negative for lumps, goiter, pain and significant neck swelling RESPIRATORY: Negative for cough,  wheezing CARDIOVASCULAR: Negative for chest pain, leg swelling, palpitations, orthopnea GI: SEE HPI MUSCULOSKELETAL: Negative for joint pain or swelling, back pain, and muscle pain. SKIN: Negative for lesions, rash HEMATOLOGY Negative for prolonged bleeding, bruising easily, and swollen nodes. ENDOCRINE: Negative for cold or heat intolerance, polyuria, polydipsia and goiter. NEURO: negative for tremor, gait imbalance, syncope and seizures. The remainder of the review of systems is noncontributory.   Physical Exam: BP (!) 157/80   Pulse 72   Temp (!) 97.2 F (36.2 C)   Ht 5\' 10"  (1.778 m)   Wt 225 lb (102.1 kg)   BMI 32.28 kg/m  GENERAL: The patient is AO x3, in no acute distress. HEENT: Head is normocephalic and atraumatic. EOMI are intact. Mouth is well hydrated and without lesions. NECK: Supple. No masses LUNGS: Clear to auscultation. No presence of rhonchi/wheezing/rales. Adequate chest expansion HEART: RRR, normal s1 and s2. ABDOMEN: Soft, nontender, no guarding, no peritoneal signs, and nondistended. BS +. No masses.  Imaging/Labs: as above      No data to display         No results found for: "IRON", "TIBC", "FERRITIN"  I personally reviewed and interpreted the available labs, imaging and endoscopic files.  Impression and Plan:  Rodney Wilson is a 83 y.o. male with history of colonic tubular adenomas on past 2 colonoscopies  (total of 9 TA's ) who presents for evaluation of surveillance colonoscopy prior to scheduling the procedure .  #Colon polyps #Surveillance colonoscopy   As per [name]  Multi-Society Task Force on Colorectal Cancer recommendation; For individuals ages 15 to 20, the decision to start or continue screening should be individualized. After shared decision-making with the patient  to weigh the risks and benefits of surveillance colonoscopy , given patient is well-functioning independent of activities of daily living and history of large colonic  polyp burden would like to pursue colonoscopy  -Schedule surveillance colonoscopy  #Hypertension  The patient was found to have elevated blood pressure when vital signs were checked in the office. The blood pressure was rechecked by the nursing staff and it was found be persistently elevated >140/90 mmHg. I personally advised to the patient to follow up closely with PCP for hypertension control.   All questions were answered.      [name], MD Gastroenterology and Hepatology [facility] Gastroenterology   This chart has been completed using [facility] Dictation software, and while attempts have been made to ensure accuracy , certain words and phrases may not be transcribed as intended

## 2023-11-01 NOTE — Patient Instructions (Signed)
It was very nice to meet you today, as dicussed with will plan for the following :  1) Colonoscopy

## 2023-11-01 NOTE — H&P (View-Only) (Signed)
 Rodney Wilson , M.D. Gastroenterology & Hepatology [facility] [address] [place], [place] [address] Primary Care Physician: Rodney Perches, MD [address] [place] [address]  Chief Complaint:  Surveillance colonoscopy , history of colon polyps   History of Present Illness: Rodney Wilson is a 83 y.o. male with history of colonic tubular adenomas on past 2 colonoscopies  (total of 9 TA's ) who presents for evaluation of surveillance colonoscopy prior to scheduling the procedure .  Last colonoscopy 2020 with Dr. Karilyn Wilson The patient denies having any nausea, vomiting, fever, chills, hematochezia, melena, hematemesis, abdominal distention, abdominal pain, diarrhea, jaundice, pruritus or weight loss.  Last QMV:HQIO Last Colonoscopy:2020  - Three small polyps in the sigmoid colon and in the ascending colon, removed with a cold snare. Resected and retrieved. - One small polyp in the proximal ascending colon. Biopsied. - Diverticulosis in the sigmoid colon. - External hemorrhoids.  4 small polyps removed and they are all tubular adenomas.  He had 5 small tubular adenomas removed on his prior colonoscopy 5 years ago.   FHx: neg for any gastrointestinal/liver disease, no malignancies Social: neg smoking, alcohol or illicit drug use Surgical: Appendectomy, umbilical hernia repair  Last labs from December 2024 hemoglobin 16.6 platelet 218 normal liver enzymes  Past Medical History: Past Medical History:  Diagnosis Date   History of kidney stones    Hypertension     Past Surgical History: Past Surgical History:  Procedure Laterality Date   APPENDECTOMY     COLONOSCOPY N/A 07/12/2013   Procedure: COLONOSCOPY;  Surgeon: Rodney Hippo, MD;  [facility];  Service: Endoscopy;  Laterality: N/A;  1030-rescheduled to 8:30am   COLONOSCOPY N/A 09/27/2018   Procedure: COLONOSCOPY;  Surgeon: Rodney Hippo, MD;   [facility];  Service: Endoscopy;  Laterality: N/A;  1030   Kidney stone removal     POLYPECTOMY  09/27/2018   Procedure: POLYPECTOMY;  Surgeon: Rodney Hippo, MD;  [facility];  Service: Endoscopy;;  ascending colon(CSx2,CBx1), sigmoid colon(CSx1)   UMBILICAL HERNIA REPAIR      Family History: Family History  Problem Relation Age of Onset   Ovarian cancer Mother    Heart attack Father    Colon cancer Neg Hx     Social History: Social History   Tobacco Use  Smoking Status Never  Smokeless Tobacco Never   Social History   Substance and Sexual Activity  Alcohol Use No   Social History   Substance and Sexual Activity  Drug Use No    Allergies: Allergies  Allergen Reactions   Other Swelling and Rash    Alpha Gal   Penicillins Swelling and Rash    Did it involve swelling of the face/tongue/throat, SOB, or low BP? No Did it involve sudden or severe rash/hives, skin peeling, or any reaction on the inside of your mouth or nose? No Did you need to seek medical attention at a hospital or doctor's office? No When did it last happen? 20-30 years ago   If all above answers are "NO", may proceed with cephalosporin use.    Prednisone Rash   Sulfa Antibiotics Swelling and Rash    Medications: Current Outpatient Medications  Medication Sig Dispense Refill   amLODipine (NORVASC) 10 MG tablet Take 10 mg by mouth daily.     aspirin EC 81 MG tablet Take 1 tablet (81 mg total) by mouth daily.     atorvastatin (  LIPITOR) 20 MG tablet Take 20 mg by mouth daily.     tamsulosin (FLOMAX) 0.4 MG CAPS capsule Take 0.8 mg by mouth daily.     No current facility-administered medications for this visit.    Review of Systems: GENERAL: negative for malaise, night sweats HEENT: No changes in hearing or vision, no nose bleeds or other nasal problems. NECK: Negative for lumps, goiter, pain and significant neck swelling RESPIRATORY: Negative for cough,  wheezing CARDIOVASCULAR: Negative for chest pain, leg swelling, palpitations, orthopnea GI: SEE HPI MUSCULOSKELETAL: Negative for joint pain or swelling, back pain, and muscle pain. SKIN: Negative for lesions, rash HEMATOLOGY Negative for prolonged bleeding, bruising easily, and swollen nodes. ENDOCRINE: Negative for cold or heat intolerance, polyuria, polydipsia and goiter. NEURO: negative for tremor, gait imbalance, syncope and seizures. The remainder of the review of systems is noncontributory.   Physical Exam: BP (!) 157/80   Pulse 72   Temp (!) 97.2 F (36.2 C)   Ht 5\' 10"  (1.778 m)   Wt 225 lb (102.1 kg)   BMI 32.28 kg/m  GENERAL: The patient is AO x3, in no acute distress. HEENT: Head is normocephalic and atraumatic. EOMI are intact. Mouth is well hydrated and without lesions. NECK: Supple. No masses LUNGS: Clear to auscultation. No presence of rhonchi/wheezing/rales. Adequate chest expansion HEART: RRR, normal s1 and s2. ABDOMEN: Soft, nontender, no guarding, no peritoneal signs, and nondistended. BS +. No masses.  Imaging/Labs: as above      No data to display         No results found for: "IRON", "TIBC", "FERRITIN"  I personally reviewed and interpreted the available labs, imaging and endoscopic files.  Impression and Plan:  Rodney Wilson is a 83 y.o. male with history of colonic tubular adenomas on past 2 colonoscopies  (total of 9 TA's ) who presents for evaluation of surveillance colonoscopy prior to scheduling the procedure .  #Colon polyps #Surveillance colonoscopy   As per [name]  Multi-Society Task Force on Colorectal Cancer recommendation; For individuals ages 15 to 20, the decision to start or continue screening should be individualized. After shared decision-making with the patient  to weigh the risks and benefits of surveillance colonoscopy , given patient is well-functioning independent of activities of daily living and history of large colonic  polyp burden would like to pursue colonoscopy  -Schedule surveillance colonoscopy  #Hypertension  The patient was found to have elevated blood pressure when vital signs were checked in the office. The blood pressure was rechecked by the nursing staff and it was found be persistently elevated >140/90 mmHg. I personally advised to the patient to follow up closely with PCP for hypertension control.   All questions were answered.      Rodney Lawman, MD Gastroenterology and Hepatology [facility] Gastroenterology   This chart has been completed using [facility] Dictation software, and while attempts have been made to ensure accuracy , certain words and phrases may not be transcribed as intended

## 2023-11-02 ENCOUNTER — Encounter (INDEPENDENT_AMBULATORY_CARE_PROVIDER_SITE_OTHER): Payer: Self-pay

## 2023-11-14 NOTE — Patient Instructions (Signed)
 Rodney Wilson  11/14/2023     @PREFPERIOPPHARMACY @   Your procedure is scheduled on  11/17/2023.   [facility] to [name] at  0745  A.M.   Call this number if you have problems the morning of surgery:  [phone]  If you experience any cold or flu symptoms such as cough, fever, chills, shortness of breath, etc. between now and your scheduled surgery, please notify us at the above number.   Remember:  Follow the diet and prep instructions given to you by the office.   You may drink clear liquids until  0545 am on 11/17/2023.    Clear liquids allowed are:                    Water, Juice (No red color; non-citric and without pulp; diabetics please choose diet or no sugar options), Carbonated beverages (diabetics please choose diet or no sugar options), Clear Tea (No creamer, milk, or cream, including half & half and powdered creamer), Black Coffee Only (No creamer, milk or cream, including half & half and powdered creamer), and Clear Sports drink (No red color; diabetics please choose diet or no sugar options)    Take these medicines the morning of surgery with A SIP OF WATER                                                          amlodipine.    Do not wear jewelry, make-up or nail polish, including gel polish,  artificial nails, or any other type of covering on natural nails (fingers and  toes).  Do not wear lotions, powders, or perfumes, or deodorant.  Do not shave 48 hours prior to surgery.  Men may shave face and neck.  Do not bring valuables to the hospital.  [facility] is not responsible for any belongings or valuables.  Contacts, dentures or bridgework may not be worn into surgery.  Leave your suitcase in the car.  After surgery it may be brought to your room.  For patients admitted to the hospital, discharge time will be determined by your treatment team.  Patients discharged the day of surgery will not be allowed to drive home and must have someone with them  for 24 hours.    Special instructions:   DO NOT smoke tobacco or vape for 24 hours before your procedure.  Please read over the following fact sheets that you were given. Anesthesia Post-op Instructions and Care and Recovery After Surgery      Colonoscopy, Adult, Care After The following information offers guidance on how to care for yourself after your procedure. Your health care provider may also give you more specific instructions. If you have problems or questions, contact your health care provider. What can I expect after the procedure? After the procedure, it is common to have: A small amount of blood in your stool for 24 hours after the procedure. Some gas. Mild cramping or bloating of your abdomen. Follow these instructions at home: Eating and drinking  Drink enough fluid to keep your urine pale yellow. Follow instructions from your health care provider about eating or drinking restrictions. Resume your normal diet as told by your health care provider. Avoid heavy or fried foods that are hard to digest. Activity Rest  as told by your health care provider. Avoid sitting for a long time without moving. Get up to take short walks every 1-2 hours. This is important to improve blood flow and breathing. Ask for help if you feel weak or unsteady. Return to your normal activities as told by your health care provider. Ask your health care provider what activities are safe for you. Managing cramping and bloating  Try walking around when you have cramps or feel bloated. If directed, apply heat to your abdomen as told by your health care provider. Use the heat source that your health care provider recommends, such as a moist heat pack or a heating pad. Place a towel between your skin and the heat source. Leave the heat on for 20-30 minutes. Remove the heat if your skin turns bright red. This is especially important if you are unable to feel pain, heat, or cold. You have a greater risk of  getting burned. General instructions If you were given a sedative during the procedure, it can affect you for several hours. Do not drive or operate machinery until your health care provider says that it is safe. For the first 24 hours after the procedure: Do not sign important documents. Do not drink alcohol. Do your regular daily activities at a slower pace than normal. Eat soft foods that are easy to digest. Take over-the-counter and prescription medicines only as told by your health care provider. Keep all follow-up visits. This is important. Contact a health care provider if: You have blood in your stool 2-3 days after the procedure. Get help right away if: You have more than a small spotting of blood in your stool. You have large blood clots in your stool. You have swelling of your abdomen. You have nausea or vomiting. You have a fever. You have increasing pain in your abdomen that is not relieved with medicine. These symptoms may be an emergency. Get help right away. Call 911. Do not wait to see if the symptoms will go away. Do not drive yourself to the hospital. Summary After the procedure, it is common to have a small amount of blood in your stool. You may also have mild cramping and bloating of your abdomen. If you were given a sedative during the procedure, it can affect you for several hours. Do not drive or operate machinery until your health care provider says that it is safe. Get help right away if you have a lot of blood in your stool, nausea or vomiting, a fever, or increased pain in your abdomen. This information is not intended to replace advice given to you by your health care provider. Make sure you discuss any questions you have with your health care provider. Document Revised: 09/28/2022 Document Reviewed: 04/08/2021 Elsevier Patient Education  2024 [employer].General Anesthesia, Adult, Care After The following information offers guidance on how to care for  yourself after your procedure. Your health care provider may also give you more specific instructions. If you have problems or questions, contact your health care provider. What can I expect after the procedure? After the procedure, it is common for people to: Have pain or discomfort at the IV site. Have nausea or vomiting. Have a sore throat or hoarseness. Have trouble concentrating. Feel cold or chills. Feel weak, sleepy, or tired (fatigue). Have soreness and body aches. These can affect parts of the body that were not involved in surgery. Follow these instructions at home: For the time period you were told by your  health care provider:  Rest. Do not participate in activities where you could fall or become injured. Do not drive or use machinery. Do not drink alcohol. Do not take sleeping pills or medicines that cause drowsiness. Do not make important decisions or sign legal documents. Do not take care of children on your own. General instructions Drink enough fluid to keep your urine pale yellow. If you have sleep apnea, surgery and certain medicines can increase your risk for breathing problems. Follow instructions from your health care provider about wearing your sleep device: Anytime you are sleeping, including during daytime naps. While taking prescription pain medicines, sleeping medicines, or medicines that make you drowsy. Return to your normal activities as told by your health care provider. Ask your health care provider what activities are safe for you. Take over-the-counter and prescription medicines only as told by your health care provider. Do not use any products that contain nicotine or tobacco. These products include cigarettes, chewing tobacco, and vaping devices, such as e-cigarettes. These can delay incision healing after surgery. If you need help quitting, ask your health care provider. Contact a health care provider if: You have nausea or vomiting that does not get  better with medicine. You vomit every time you eat or drink. You have pain that does not get better with medicine. You cannot urinate or have bloody urine. You develop a skin rash. You have a fever. Get help right away if: You have trouble breathing. You have chest pain. You vomit blood. These symptoms may be an emergency. Get help right away. Call 911. Do not wait to see if the symptoms will go away. Do not drive yourself to the hospital. Summary After the procedure, it is common to have a sore throat, hoarseness, nausea, vomiting, or to feel weak, sleepy, or fatigue. For the time period you were told by your health care provider, do not drive or use machinery. Get help right away if you have difficulty breathing, have chest pain, or vomit blood. These symptoms may be an emergency. This information is not intended to replace advice given to you by your health care provider. Make sure you discuss any questions you have with your health care provider. Document Revised: 11/13/2021 Document Reviewed: 11/13/2021 Elsevier Patient Education  2024 [employer].

## 2023-11-15 ENCOUNTER — Encounter (HOSPITAL_COMMUNITY): Payer: Self-pay

## 2023-11-15 ENCOUNTER — Other Ambulatory Visit: Payer: Self-pay

## 2023-11-15 ENCOUNTER — Encounter (HOSPITAL_COMMUNITY)
Admission: RE | Admit: 2023-11-15 | Discharge: 2023-11-15 | Disposition: A | Discharge status: Home / Self Care | Source: Ambulatory Visit | Attending: Gastroenterology | Admitting: Gastroenterology

## 2023-11-15 VITALS — BP 157/80 | HR 72 | Temp 97.2°F | Resp 18 | Ht 70.0 in | Wt 225.0 lb

## 2023-11-15 DIAGNOSIS — Z01810 Encounter for preprocedural cardiovascular examination: Secondary | ICD-10-CM | POA: Insufficient documentation

## 2023-11-15 DIAGNOSIS — I10 Essential (primary) hypertension: Secondary | ICD-10-CM | POA: Diagnosis not present

## 2023-11-17 ENCOUNTER — Ambulatory Visit (HOSPITAL_COMMUNITY): Admitting: Anesthesiology

## 2023-11-17 ENCOUNTER — Encounter (HOSPITAL_COMMUNITY)
Admission: RE | Disposition: A | Discharge status: Home / Self Care | Payer: Self-pay | Source: Home / Self Care | Attending: Gastroenterology

## 2023-11-17 ENCOUNTER — Ambulatory Visit (HOSPITAL_COMMUNITY)
Admission: RE | Admit: 2023-11-17 | Discharge: 2023-11-17 | Disposition: A | Discharge status: Home / Self Care | Attending: Gastroenterology | Admitting: Gastroenterology

## 2023-11-17 ENCOUNTER — Encounter (HOSPITAL_COMMUNITY): Payer: Self-pay | Admitting: Gastroenterology

## 2023-11-17 DIAGNOSIS — D123 Benign neoplasm of transverse colon: Secondary | ICD-10-CM

## 2023-11-17 DIAGNOSIS — K648 Other hemorrhoids: Secondary | ICD-10-CM | POA: Diagnosis not present

## 2023-11-17 DIAGNOSIS — Z1211 Encounter for screening for malignant neoplasm of colon: Secondary | ICD-10-CM | POA: Diagnosis not present

## 2023-11-17 DIAGNOSIS — K635 Polyp of colon: Secondary | ICD-10-CM

## 2023-11-17 DIAGNOSIS — D120 Benign neoplasm of cecum: Secondary | ICD-10-CM | POA: Insufficient documentation

## 2023-11-17 DIAGNOSIS — I10 Essential (primary) hypertension: Secondary | ICD-10-CM | POA: Insufficient documentation

## 2023-11-17 DIAGNOSIS — Z860101 Personal history of adenomatous and serrated colon polyps: Secondary | ICD-10-CM | POA: Diagnosis not present

## 2023-11-17 HISTORY — PX: SUBMUCOSAL INJECTION: SHX5543

## 2023-11-17 HISTORY — PX: COLONOSCOPY: SHX5424

## 2023-11-17 HISTORY — PX: POLYPECTOMY: SHX5525

## 2023-11-17 LAB — HM COLONOSCOPY

## 2023-11-17 SURGERY — COLONOSCOPY
Anesthesia: General

## 2023-11-17 MED ORDER — SODIUM CHLORIDE 0.9% FLUSH: 3.0000 mL | INTRAVENOUS | Status: DC | PRN

## 2023-11-17 MED ORDER — SPOT INK MARKER SYRINGE KIT
PACK | SUBMUCOSAL | Status: DC | PRN
  Administered 2023-11-17: 1 mL via SUBMUCOSAL

## 2023-11-17 MED ORDER — LACTATED RINGERS IV SOLN: INTRAVENOUS | Status: DC | PRN

## 2023-11-17 MED ORDER — PROPOFOL 500 MG/50ML IV EMUL
INTRAVENOUS | Status: DC | PRN
  Administered 2023-11-17: 150 ug/kg/min via INTRAVENOUS

## 2023-11-17 MED ORDER — SODIUM CHLORIDE 0.9% FLUSH: 3.0000 mL | Freq: Two times a day (BID) | INTRAVENOUS | Status: DC

## 2023-11-17 MED ORDER — LIDOCAINE HCL (CARDIAC) PF 100 MG/5ML IV SOSY
PREFILLED_SYRINGE | INTRAVENOUS | Status: DC | PRN
  Administered 2023-11-17: 50 mg via INTRATRACHEAL

## 2023-11-17 MED ORDER — POLYETHYLENE GLYCOL 3350 17 GM/SCOOP PO POWD: 8.5000 g | Freq: Two times a day (BID) | ORAL | Status: AC

## 2023-11-17 MED ORDER — PROPOFOL 10 MG/ML IV BOLUS
INTRAVENOUS | Status: DC | PRN
  Administered 2023-11-17: 50 mg via INTRAVENOUS

## 2023-11-17 NOTE — Interval H&P Note (Signed)
 History and Physical Interval Note:  11/17/2023 8:29 AM  Rodney Wilson  has presented today for surgery, with the diagnosis of History of polyps.  The various methods of treatment have been discussed with the patient and family. After consideration of risks, benefits and other options for treatment, the patient has consented to  Procedure(s) with comments: COLONOSCOPY (N/A) - 9:45am;asa 1-2 as a surgical intervention.  The patient's history has been reviewed, patient examined, no change in status, stable for surgery.  I have reviewed the patient's chart and labs.  Questions were answered to the patient's satisfaction.     [name]

## 2023-11-17 NOTE — Op Note (Signed)
 [facility] Patient Name: Rodney Wilson Procedure Date: 11/17/2023 9:06 AM MRN: [id number] Date of Birth: 03-Nov-1940 Attending MD: Sanjuan Dame , MD, [id number] CSN: [id number] Age: 83 Admit Type: Outpatient Procedure:                Colonoscopy Indications:              High risk colon cancer surveillance: Personal                            history of colonic polyps Providers:                Sanjuan Dame, MD, Francoise Ceo RN, RN, Pandora Leiter, Technician Referring MD:              Medicines:                Monitored Anesthesia Care Complications:            No immediate complications. Estimated Blood Loss:     Estimated blood loss was minimal. Procedure:                Pre-Anesthesia Assessment:                           - Prior to the procedure, a History and Physical                            was performed, and patient medications and                            allergies were reviewed. The patient's tolerance of                            previous anesthesia was also reviewed. The risks                            and benefits of the procedure and the sedation                            options and risks were discussed with the patient.                            All questions were answered, and informed consent                            was obtained. Prior Anticoagulants: The patient has                            taken no anticoagulant or antiplatelet agents                            except for aspirin. ASA Grade Assessment: III - A  patient with severe systemic disease. After                            reviewing the risks and benefits, the patient was                            deemed in satisfactory condition to undergo the                            procedure.                           After obtaining informed consent, the colonoscope                            was passed under direct vision. Throughout the                             procedure, the patient's blood pressure, pulse, and                            oxygen saturations were monitored continuously. The                            [phone]) scope was introduced through the                            anus and advanced to the the cecum, identified by                            appendiceal orifice and ileocecal valve. The                            colonoscopy was performed without difficulty. The                            patient tolerated the procedure well. The quality                            of the bowel preparation was evaluated using the                            BBPS [facility] Bowel Preparation Scale) with scores                            of: Right Colon = 3, Transverse Colon = 3 and Left                            Colon = 3 (entire mucosa seen well with no residual                            staining, small fragments of stool or opaque  liquid). The total BBPS score equals 9. The                            ileocecal valve, appendiceal orifice, and rectum                            were photographed. Scope In: 9:24:40 AM Scope Out: 10:07:38 AM Scope Withdrawal Time: 0 hours 40 minutes 49 seconds  Total Procedure Duration: 0 hours 42 minutes 58 seconds  Findings:      The perianal and digital rectal examinations were normal.      A 4 mm polyp was found in the cecum. The polyp was sessile. The polyp       was removed with a cold snare. Resection and retrieval were complete.       Area was tattooed with an injection of 1 mL of Spot (carbon black).      A 30 mm polyp was found in the transverse colon. The polyp was half       circumferential extending two folds. Preparations were made for mucosal       resection. Demarcation of the lesion was performed with high-definition       white light and narrow band imaging to clearly identify the boundaries       of the lesion. Eleview was injected to  raise the lesion. Piecemeal       mucosal resection using a snare was performed. Resection and retrieval       were complete. Resected tissue margins were examined and clear of polyp       tissue.      Non-bleeding internal hemorrhoids were found during retroflexion. The       hemorrhoids were small. Impression:               - One 4 mm polyp in the cecum, removed with a cold                            snare. Resected and retrieved. Tattooed.                           - One 30 mm polyp in the transverse colon, removed                            with mucosal resection. Resected and retrieved.                            Cold piecemeal EMR performed                           - Non-bleeding internal hemorrhoids.                           - Mucosal resection was performed. Resection and                            retrieval were complete. Moderate Sedation:      Per Anesthesia Care Recommendation:           - Patient has a contact number available for  emergencies. The signs and symptoms of potential                            delayed complications were discussed with the                            patient. Return to normal activities tomorrow.                            Written discharge instructions were provided to the                            patient.                           - High fiber diet.                           - Continue present medications.                           - Await pathology results.                           - Repeat colonoscopy in 1 year for surveillance                            given large piecemeal resection of large polyp ; if                            medically fit.                           - Return to primary care physician as previously                            scheduled. Procedure Code(s):        --- Professional ---                           [phone], Colonoscopy, flexible; with endoscopic                            mucosal  resection                           45385, 59, Colonoscopy, flexible; with removal of                            tumor(s), polyp(s), or other lesion(s) by snare                            technique                           45381, 59, Colonoscopy, flexible; with directed  submucosal injection(s), any substance Diagnosis Code(s):        --- Professional ---                           Z86.010, Personal history of colonic polyps                           D12.0, Benign neoplasm of cecum                           D12.3, Benign neoplasm of transverse colon (hepatic                            flexure or splenic flexure)                           K64.8, Other hemorrhoids CPT copyright 2022 American Medical Association. All rights reserved. The codes documented in this report are preliminary and upon coder review may  be revised to meet current compliance requirements. Sanjuan Dame, MD Sanjuan Dame, MD 11/17/2023 10:19:48 AM This report has been signed electronically. Number of Addenda: 0

## 2023-11-17 NOTE — Anesthesia Postprocedure Evaluation (Signed)
 Anesthesia Post Note  Patient: LASON EVELAND  Procedure(s) Performed: COLONOSCOPY POLYPECTOMY INJECTION, SUBMUCOSAL  Patient location during evaluation: Short Stay Anesthesia Type: General Level of consciousness: awake Pain management: pain level controlled Vital Signs Assessment: post-procedure vital signs reviewed and stable Respiratory status: spontaneous breathing Cardiovascular status: blood pressure returned to baseline Postop Assessment: no apparent nausea or vomiting Anesthetic complications: no   No notable events documented.   Last Vitals:  Vitals:   11/17/23 0820 11/17/23 1015  BP: (!) 153/77 (!) 138/119  Pulse:  72  Resp: 16 19  Temp: 36.7 C 36.4 C  SpO2: 94% 91%    Last Pain:  Vitals:   11/17/23 1015  TempSrc: Oral  PainSc: 0-No pain                 Ophie Burrowes

## 2023-11-17 NOTE — Discharge Instructions (Addendum)
  Discharge instructions Please read the instructions outlined below and refer to this sheet in the next few weeks. These discharge instructions provide you with general information on caring for yourself after you leave the hospital. Your doctor may also give you specific instructions. While your treatment has been planned according to the most current medical practices available, unavoidable complications occasionally occur. If you have any problems or questions after discharge, please call your doctor. ACTIVITY You may resume your regular activity but move at a slower pace for the next 24 hours.  Take frequent rest periods for the next 24 hours.  Walking will help expel (get rid of) the air and reduce the bloated feeling in your abdomen.  No driving for 24 hours (because of the anesthesia (medicine) used during the test).  You may shower.  Do not sign any important legal documents or operate any machinery for 24 hours (because of the anesthesia used during the test).  NUTRITION Drink plenty of fluids.  You may resume your normal diet.  Begin with a light meal and progress to your normal diet.  Avoid alcoholic beverages for 24 hours or as instructed by your caregiver.  MEDICATIONS You may resume your normal medications unless your caregiver tells you otherwise.  WHAT YOU CAN EXPECT TODAY You may experience abdominal discomfort such as a feeling of fullness or "gas" pains.  FOLLOW-UP Your doctor will discuss the results of your test with you.  SEEK IMMEDIATE MEDICAL ATTENTION IF ANY OF THE FOLLOWING OCCUR: Excessive nausea (feeling sick to your stomach) and/or vomiting.  Severe abdominal pain and distention (swelling).  Trouble swallowing.  Temperature over 101 F (37.8 C).  Rectal bleeding or vomiting of blood.     Stop using high dose aspirin including Goody/BC powders, NSAIDs such as Aleve, ibuprofen, naproxen, Motrin, Voltaren or Advil (even the topical ones) for 14 days   Take  miralax twice daily for 10 days   I hope you have a great rest of your week!   [name] , M.D.. Gastroenterology and Hepatology [facility]

## 2023-11-17 NOTE — Anesthesia Preprocedure Evaluation (Signed)
 Anesthesia Evaluation  Patient identified by MRN, date of birth, ID band Patient awake    Reviewed: Allergy & Precautions, H&P , NPO status , Patient's Chart, lab work & pertinent test results, reviewed documented beta blocker date and time   Airway Mallampati: II  TM Distance: >3 FB Neck ROM: full    Dental no notable dental hx. (+) Dental Advisory Given, Teeth Intact   Pulmonary neg pulmonary ROS   Pulmonary exam normal breath sounds clear to auscultation       Cardiovascular Exercise Tolerance: Good hypertension, Normal cardiovascular exam Rhythm:regular Rate:Normal     Neuro/Psych negative neurological ROS  negative psych ROS   GI/Hepatic negative GI ROS, Neg liver ROS,,,  Endo/Other  negative endocrine ROS    Renal/GU negative Renal ROS  negative genitourinary   Musculoskeletal   Abdominal   Peds  Hematology negative hematology ROS (+)   Anesthesia Other Findings   Reproductive/Obstetrics negative OB ROS                             Anesthesia Physical Anesthesia Plan  ASA: 2  Anesthesia Plan: General   Post-op Pain Management: Minimal or no pain anticipated   Induction: Intravenous  PONV Risk Score and Plan: Propofol infusion  Airway Management Planned: Nasal Cannula and Natural Airway  Additional Equipment: None  Intra-op Plan:   Post-operative Plan:   Informed Consent: I have reviewed the patients History and Physical, chart, labs and discussed the procedure including the risks, benefits and alternatives for the proposed anesthesia with the patient or authorized representative who has indicated his/her understanding and acceptance.     Dental Advisory Given  Plan Discussed with: CRNA  Anesthesia Plan Comments:         Anesthesia Quick Evaluation

## 2023-11-17 NOTE — Transfer of Care (Signed)
 Immediate Anesthesia Transfer of Care Note  Patient: Rodney Wilson  Procedure(s) Performed: COLONOSCOPY POLYPECTOMY INJECTION, SUBMUCOSAL  Patient Location: Short Stay  Anesthesia Type:General  Level of Consciousness: awake  Airway & Oxygen Therapy: Patient Spontanous Breathing  Post-op Assessment: Report given to RN  Post vital signs: Reviewed and stable  Last Vitals:  Vitals Value Taken Time  BP 138/119 11/17/23 1015  Temp    Pulse 74 11/17/23 1017  Resp 27 11/17/23 1017  SpO2 91 % 11/17/23 1017  Vitals shown include unfiled device data.  Last Pain:  Vitals:   11/17/23 0915  PainSc: 0-No pain         Complications: No notable events documented.

## 2023-11-18 ENCOUNTER — Encounter (HOSPITAL_COMMUNITY): Payer: Self-pay | Admitting: Gastroenterology

## 2023-11-18 ENCOUNTER — Encounter (INDEPENDENT_AMBULATORY_CARE_PROVIDER_SITE_OTHER): Payer: Self-pay | Admitting: *Deleted

## 2023-11-18 LAB — SURGICAL PATHOLOGY

## 2023-11-21 ENCOUNTER — Encounter (INDEPENDENT_AMBULATORY_CARE_PROVIDER_SITE_OTHER): Payer: Self-pay | Admitting: *Deleted

## 2023-11-21 NOTE — Progress Notes (Signed)
 I reviewed the pathology results. Ann, can you send her a letter with the findings as described below please?  Repeat colonoscopy in 1 years, although need to see in clinic prior to scheduling next colonoscopy   Thanks,  [name], MD Gastroenterology and Hepatology [facility]  ---------------------------------------------------------------------------------------------  [facility] [address], [place], [place] [address] Phone:  [phone]   11/21/23 [place], [place]   Dear [facility],  I am writing to inform you that the biopsies taken during your recent endoscopic examination showed:  A. COLON, CECAL, TRANSVERSE, POLYPECTOMY:       Fragments of tubular adenoma.       Negative for high-grade dysplasia.    What does this mean?   You had a total of 2 polyps removed. The pathology came back as "tubular adenoma." These findings are NOT cancer, but had the polyps remained in your colon, they could have turned into cancer.  Given these findings, it is recommended that your next colonoscopy be performed in 1 year as one of the polyp was large which was removed in pieces .  As per [name]  Multi-Society Task Force on Colorectal Cancer recommendation; For individuals ages 43 to 31, the decision to start or continue screening should be individualized.   Also I value your feedback , so if you get a survey , please take the time to fill it out and thank you for choosing [facility]  Please call us at [phone] if you have persistent problems or have questions about your condition that have not been fully answered at this time.  Sincerely,  [name], MD Gastroenterology and Hepatology

## 2024-02-09 DIAGNOSIS — D225 Melanocytic nevi of trunk: Secondary | ICD-10-CM | POA: Diagnosis not present

## 2024-02-09 DIAGNOSIS — D692 Other nonthrombocytopenic purpura: Secondary | ICD-10-CM | POA: Diagnosis not present

## 2024-02-09 DIAGNOSIS — L570 Actinic keratosis: Secondary | ICD-10-CM | POA: Diagnosis not present

## 2024-02-09 DIAGNOSIS — Z85828 Personal history of other malignant neoplasm of skin: Secondary | ICD-10-CM | POA: Diagnosis not present

## 2024-02-09 DIAGNOSIS — L821 Other seborrheic keratosis: Secondary | ICD-10-CM | POA: Diagnosis not present

## 2024-02-09 DIAGNOSIS — L720 Epidermal cyst: Secondary | ICD-10-CM | POA: Diagnosis not present

## 2024-02-09 DIAGNOSIS — L814 Other melanin hyperpigmentation: Secondary | ICD-10-CM | POA: Diagnosis not present

## 2024-02-16 DIAGNOSIS — Z8673 Personal history of transient ischemic attack (TIA), and cerebral infarction without residual deficits: Secondary | ICD-10-CM | POA: Diagnosis not present

## 2024-02-16 DIAGNOSIS — I10 Essential (primary) hypertension: Secondary | ICD-10-CM | POA: Diagnosis not present

## 2024-05-12 DIAGNOSIS — S01511A Laceration without foreign body of lip, initial encounter: Secondary | ICD-10-CM | POA: Diagnosis not present

## 2024-05-12 DIAGNOSIS — Z880 Allergy status to penicillin: Secondary | ICD-10-CM | POA: Diagnosis not present

## 2024-05-12 DIAGNOSIS — Z882 Allergy status to sulfonamides status: Secondary | ICD-10-CM | POA: Diagnosis not present

## 2024-05-12 DIAGNOSIS — N400 Enlarged prostate without lower urinary tract symptoms: Secondary | ICD-10-CM | POA: Diagnosis not present

## 2024-05-12 DIAGNOSIS — S61411A Laceration without foreign body of right hand, initial encounter: Secondary | ICD-10-CM | POA: Diagnosis not present

## 2024-05-12 DIAGNOSIS — Z86718 Personal history of other venous thrombosis and embolism: Secondary | ICD-10-CM | POA: Diagnosis not present

## 2024-05-12 DIAGNOSIS — Z7982 Long term (current) use of aspirin: Secondary | ICD-10-CM | POA: Diagnosis not present

## 2024-05-12 DIAGNOSIS — Z85828 Personal history of other malignant neoplasm of skin: Secondary | ICD-10-CM | POA: Diagnosis not present

## 2024-05-12 DIAGNOSIS — I10 Essential (primary) hypertension: Secondary | ICD-10-CM | POA: Diagnosis not present

## 2024-05-12 DIAGNOSIS — S80212A Abrasion, left knee, initial encounter: Secondary | ICD-10-CM | POA: Diagnosis not present

## 2024-05-12 DIAGNOSIS — S0181XA Laceration without foreign body of other part of head, initial encounter: Secondary | ICD-10-CM | POA: Diagnosis not present

## 2024-05-12 DIAGNOSIS — Z79899 Other long term (current) drug therapy: Secondary | ICD-10-CM | POA: Diagnosis not present

## 2024-05-12 DIAGNOSIS — S51012A Laceration without foreign body of left elbow, initial encounter: Secondary | ICD-10-CM | POA: Diagnosis not present

## 2024-05-16 DIAGNOSIS — N401 Enlarged prostate with lower urinary tract symptoms: Secondary | ICD-10-CM | POA: Diagnosis not present

## 2024-05-16 DIAGNOSIS — N323 Diverticulum of bladder: Secondary | ICD-10-CM | POA: Diagnosis not present

## 2024-05-16 DIAGNOSIS — Z8249 Family history of ischemic heart disease and other diseases of the circulatory system: Secondary | ICD-10-CM | POA: Diagnosis not present

## 2024-05-16 DIAGNOSIS — M109 Gout, unspecified: Secondary | ICD-10-CM | POA: Diagnosis not present

## 2024-05-16 DIAGNOSIS — Z79899 Other long term (current) drug therapy: Secondary | ICD-10-CM | POA: Diagnosis not present

## 2024-05-16 DIAGNOSIS — Z86718 Personal history of other venous thrombosis and embolism: Secondary | ICD-10-CM | POA: Diagnosis not present

## 2024-05-16 DIAGNOSIS — R3914 Feeling of incomplete bladder emptying: Secondary | ICD-10-CM | POA: Diagnosis not present

## 2024-05-16 DIAGNOSIS — R339 Retention of urine, unspecified: Secondary | ICD-10-CM | POA: Diagnosis not present

## 2024-05-16 DIAGNOSIS — K5790 Diverticulosis of intestine, part unspecified, without perforation or abscess without bleeding: Secondary | ICD-10-CM | POA: Diagnosis not present

## 2024-05-16 DIAGNOSIS — E785 Hyperlipidemia, unspecified: Secondary | ICD-10-CM | POA: Diagnosis not present

## 2024-05-16 DIAGNOSIS — Z7982 Long term (current) use of aspirin: Secondary | ICD-10-CM | POA: Diagnosis not present

## 2024-05-16 DIAGNOSIS — Z882 Allergy status to sulfonamides status: Secondary | ICD-10-CM | POA: Diagnosis not present

## 2024-05-16 DIAGNOSIS — Z880 Allergy status to penicillin: Secondary | ICD-10-CM | POA: Diagnosis not present

## 2024-05-16 DIAGNOSIS — I10 Essential (primary) hypertension: Secondary | ICD-10-CM | POA: Diagnosis not present

## 2024-05-21 DIAGNOSIS — Z4802 Encounter for removal of sutures: Secondary | ICD-10-CM | POA: Diagnosis not present

## 2024-06-19 DIAGNOSIS — Z23 Encounter for immunization: Secondary | ICD-10-CM | POA: Diagnosis not present

## 2024-08-13 DIAGNOSIS — H02831 Dermatochalasis of right upper eyelid: Secondary | ICD-10-CM | POA: Diagnosis not present

## 2024-08-13 DIAGNOSIS — H02834 Dermatochalasis of left upper eyelid: Secondary | ICD-10-CM | POA: Diagnosis not present

## 2024-08-13 DIAGNOSIS — H2513 Age-related nuclear cataract, bilateral: Secondary | ICD-10-CM | POA: Diagnosis not present

## 2024-08-13 DIAGNOSIS — D3131 Benign neoplasm of right choroid: Secondary | ICD-10-CM | POA: Diagnosis not present
